# Patient Record
Sex: Female | Born: 1975
Health system: Southern US, Community
[De-identification: ages and names within clinical notes are randomized; demographics above are authoritative.]

## PROBLEM LIST (undated history)

## (undated) DIAGNOSIS — Z9221 Personal history of antineoplastic chemotherapy: Secondary | ICD-10-CM

## (undated) DIAGNOSIS — C50919 Malignant neoplasm of unspecified site of unspecified female breast: Secondary | ICD-10-CM

## (undated) DIAGNOSIS — C4359 Malignant melanoma of other part of trunk: Secondary | ICD-10-CM

## (undated) DIAGNOSIS — Z923 Personal history of irradiation: Secondary | ICD-10-CM

## (undated) HISTORY — PX: EXPLORATORY LAPAROTOMY: SUR591

## (undated) HISTORY — DX: Malignant melanoma of other part of trunk: C43.59

---

## 1998-03-31 ENCOUNTER — Ambulatory Visit (HOSPITAL_BASED_OUTPATIENT_CLINIC_OR_DEPARTMENT_OTHER): Admission: RE | Admit: 1998-03-31 | Discharge: 1998-03-31 | Payer: Self-pay | Admitting: General Surgery

## 1999-10-01 ENCOUNTER — Emergency Department (HOSPITAL_COMMUNITY): Admission: EM | Admit: 1999-10-01 | Discharge: 1999-10-01 | Payer: Self-pay | Admitting: Emergency Medicine

## 2000-01-08 DIAGNOSIS — C4359 Malignant melanoma of other part of trunk: Secondary | ICD-10-CM

## 2000-01-08 HISTORY — DX: Malignant melanoma of other part of trunk: C43.59

## 2001-03-31 ENCOUNTER — Other Ambulatory Visit: Admission: RE | Admit: 2001-03-31 | Discharge: 2001-03-31 | Payer: Self-pay | Admitting: Gynecology

## 2001-03-31 ENCOUNTER — Encounter: Payer: Self-pay | Admitting: Dermatology

## 2001-03-31 ENCOUNTER — Encounter: Admission: RE | Admit: 2001-03-31 | Discharge: 2001-03-31 | Payer: Self-pay | Admitting: Dermatology

## 2002-04-19 ENCOUNTER — Other Ambulatory Visit: Admission: RE | Admit: 2002-04-19 | Discharge: 2002-04-19 | Payer: Self-pay | Admitting: Gynecology

## 2002-05-14 ENCOUNTER — Encounter: Payer: Self-pay | Admitting: Gynecology

## 2002-05-14 ENCOUNTER — Ambulatory Visit (HOSPITAL_COMMUNITY): Admission: RE | Admit: 2002-05-14 | Discharge: 2002-05-14 | Payer: Self-pay | Admitting: Gynecology

## 2003-04-29 ENCOUNTER — Other Ambulatory Visit: Admission: RE | Admit: 2003-04-29 | Discharge: 2003-04-29 | Payer: Self-pay | Admitting: Gynecology

## 2004-04-30 ENCOUNTER — Other Ambulatory Visit: Admission: RE | Admit: 2004-04-30 | Discharge: 2004-04-30 | Payer: Self-pay | Admitting: Gynecology

## 2004-11-02 ENCOUNTER — Encounter (INDEPENDENT_AMBULATORY_CARE_PROVIDER_SITE_OTHER): Payer: Self-pay | Admitting: *Deleted

## 2004-11-02 ENCOUNTER — Ambulatory Visit (HOSPITAL_BASED_OUTPATIENT_CLINIC_OR_DEPARTMENT_OTHER): Admission: RE | Admit: 2004-11-02 | Discharge: 2004-11-02 | Payer: Self-pay | Admitting: Gynecology

## 2005-03-25 ENCOUNTER — Other Ambulatory Visit: Admission: RE | Admit: 2005-03-25 | Discharge: 2005-03-25 | Payer: Self-pay | Admitting: Gynecology

## 2006-03-27 ENCOUNTER — Other Ambulatory Visit: Admission: RE | Admit: 2006-03-27 | Discharge: 2006-03-27 | Payer: Self-pay | Admitting: Gynecology

## 2006-06-19 ENCOUNTER — Ambulatory Visit (HOSPITAL_COMMUNITY): Admission: RE | Admit: 2006-06-19 | Discharge: 2006-06-19 | Payer: Self-pay | Admitting: Gynecology

## 2007-04-09 ENCOUNTER — Other Ambulatory Visit: Admission: RE | Admit: 2007-04-09 | Discharge: 2007-04-09 | Payer: Self-pay | Admitting: Gynecology

## 2008-01-08 HISTORY — PX: BREAST LUMPECTOMY: SHX2

## 2008-10-26 ENCOUNTER — Encounter: Admission: RE | Admit: 2008-10-26 | Discharge: 2008-10-26 | Payer: Self-pay | Admitting: Gynecology

## 2008-10-26 ENCOUNTER — Encounter (INDEPENDENT_AMBULATORY_CARE_PROVIDER_SITE_OTHER): Payer: Self-pay | Admitting: Radiology

## 2008-11-02 ENCOUNTER — Ambulatory Visit: Payer: Self-pay | Admitting: Genetic Counselor

## 2008-11-03 ENCOUNTER — Encounter: Admission: RE | Admit: 2008-11-03 | Discharge: 2008-11-03 | Payer: Self-pay | Admitting: Gynecology

## 2008-11-30 ENCOUNTER — Encounter: Admission: RE | Admit: 2008-11-30 | Discharge: 2008-11-30 | Payer: Self-pay | Admitting: General Surgery

## 2008-11-30 ENCOUNTER — Ambulatory Visit (HOSPITAL_COMMUNITY): Admission: RE | Admit: 2008-11-30 | Discharge: 2008-11-30 | Payer: Self-pay | Admitting: General Surgery

## 2008-12-15 ENCOUNTER — Ambulatory Visit: Admission: RE | Admit: 2008-12-15 | Discharge: 2008-12-15 | Payer: Self-pay | Admitting: Oncology

## 2008-12-15 ENCOUNTER — Encounter: Payer: Self-pay | Admitting: Oncology

## 2008-12-16 ENCOUNTER — Ambulatory Visit (HOSPITAL_COMMUNITY): Admission: RE | Admit: 2008-12-16 | Discharge: 2008-12-16 | Payer: Self-pay | Admitting: Oncology

## 2008-12-19 ENCOUNTER — Ambulatory Visit: Payer: Self-pay | Admitting: Genetic Counselor

## 2008-12-21 ENCOUNTER — Ambulatory Visit: Payer: Self-pay | Admitting: Oncology

## 2008-12-21 LAB — CBC WITH DIFFERENTIAL/PLATELET
Basophils Absolute: 0 10*3/uL (ref 0.0–0.1)
Eosinophils Absolute: 0.1 10*3/uL (ref 0.0–0.5)
HGB: 13.6 g/dL (ref 11.6–15.9)
LYMPH%: 34.2 % (ref 14.0–49.7)
MCV: 90 fL (ref 79.5–101.0)
MONO#: 0.4 10*3/uL (ref 0.1–0.9)
MONO%: 6.7 % (ref 0.0–14.0)
NEUT#: 3.4 10*3/uL (ref 1.5–6.5)
Platelets: 341 10*3/uL (ref 145–400)
RDW: 12.9 % (ref 11.2–14.5)

## 2008-12-21 LAB — COMPREHENSIVE METABOLIC PANEL
Albumin: 4.1 g/dL (ref 3.5–5.2)
Alkaline Phosphatase: 51 U/L (ref 39–117)
BUN: 12 mg/dL (ref 6–23)
CO2: 23 mEq/L (ref 19–32)
Glucose, Bld: 105 mg/dL — ABNORMAL HIGH (ref 70–99)
Potassium: 4.1 mEq/L (ref 3.5–5.3)

## 2008-12-21 LAB — LACTATE DEHYDROGENASE: LDH: 118 U/L (ref 94–250)

## 2008-12-21 LAB — CANCER ANTIGEN 27.29: CA 27.29: 18 U/mL (ref 0–39)

## 2008-12-23 ENCOUNTER — Ambulatory Visit (HOSPITAL_COMMUNITY): Admission: RE | Admit: 2008-12-23 | Discharge: 2008-12-23 | Payer: Self-pay | Admitting: Oncology

## 2009-01-05 ENCOUNTER — Ambulatory Visit (HOSPITAL_COMMUNITY): Admission: RE | Admit: 2009-01-05 | Discharge: 2009-01-05 | Payer: Self-pay | Admitting: General Surgery

## 2009-01-05 ENCOUNTER — Ambulatory Visit (HOSPITAL_COMMUNITY): Admission: RE | Admit: 2009-01-05 | Discharge: 2009-01-05 | Payer: Self-pay | Admitting: Oncology

## 2009-01-07 HISTORY — PX: THYROIDECTOMY: SHX17

## 2009-01-31 ENCOUNTER — Ambulatory Visit (HOSPITAL_COMMUNITY): Admission: RE | Admit: 2009-01-31 | Discharge: 2009-02-01 | Payer: Self-pay | Admitting: General Surgery

## 2009-01-31 ENCOUNTER — Encounter (INDEPENDENT_AMBULATORY_CARE_PROVIDER_SITE_OTHER): Payer: Self-pay | Admitting: General Surgery

## 2009-02-07 ENCOUNTER — Ambulatory Visit: Payer: Self-pay | Admitting: Oncology

## 2009-02-09 LAB — CBC WITH DIFFERENTIAL/PLATELET
Eosinophils Absolute: 0.2 10*3/uL (ref 0.0–0.5)
MONO#: 0.4 10*3/uL (ref 0.1–0.9)
NEUT#: 4.3 10*3/uL (ref 1.5–6.5)
Platelets: 348 10*3/uL (ref 145–400)
RBC: 4.41 10*6/uL (ref 3.70–5.45)
RDW: 12.8 % (ref 11.2–14.5)
WBC: 7.5 10*3/uL (ref 3.9–10.3)

## 2009-02-16 LAB — CBC WITH DIFFERENTIAL/PLATELET
Eosinophils Absolute: 0.2 10*3/uL (ref 0.0–0.5)
HCT: 39.3 % (ref 34.8–46.6)
HGB: 13.4 g/dL (ref 11.6–15.9)
LYMPH%: 33.4 % (ref 14.0–49.7)
MONO#: 0.6 10*3/uL (ref 0.1–0.9)
NEUT#: 4.4 10*3/uL (ref 1.5–6.5)
NEUT%: 56.3 % (ref 38.4–76.8)
Platelets: 322 10*3/uL (ref 145–400)
WBC: 7.8 10*3/uL (ref 3.9–10.3)

## 2009-02-16 LAB — COMPREHENSIVE METABOLIC PANEL
CO2: 26 mEq/L (ref 19–32)
Calcium: 8.7 mg/dL (ref 8.4–10.5)
Creatinine, Ser: 0.69 mg/dL (ref 0.40–1.20)
Glucose, Bld: 100 mg/dL — ABNORMAL HIGH (ref 70–99)
Sodium: 138 mEq/L (ref 135–145)
Total Bilirubin: 0.7 mg/dL (ref 0.3–1.2)
Total Protein: 6.9 g/dL (ref 6.0–8.3)

## 2009-02-23 LAB — CBC WITH DIFFERENTIAL/PLATELET
Eosinophils Absolute: 0.1 10*3/uL (ref 0.0–0.5)
LYMPH%: 42.6 % (ref 14.0–49.7)
MONO#: 0.3 10*3/uL (ref 0.1–0.9)
NEUT#: 2.8 10*3/uL (ref 1.5–6.5)
Platelets: 305 10*3/uL (ref 145–400)
RBC: 4.25 10*6/uL (ref 3.70–5.45)
WBC: 5.8 10*3/uL (ref 3.9–10.3)
lymph#: 2.5 10*3/uL (ref 0.9–3.3)

## 2009-03-02 LAB — CBC WITH DIFFERENTIAL/PLATELET
Basophils Absolute: 0 10*3/uL (ref 0.0–0.1)
Eosinophils Absolute: 0.1 10*3/uL (ref 0.0–0.5)
HCT: 35.6 % (ref 34.8–46.6)
HGB: 12.4 g/dL (ref 11.6–15.9)
LYMPH%: 40 % (ref 14.0–49.7)
MCHC: 34.8 g/dL (ref 31.5–36.0)
MONO#: 0.4 10*3/uL (ref 0.1–0.9)
NEUT#: 2.5 10*3/uL (ref 1.5–6.5)
NEUT%: 49.6 % (ref 38.4–76.8)
Platelets: 292 10*3/uL (ref 145–400)
WBC: 5.1 10*3/uL (ref 3.9–10.3)
lymph#: 2.1 10*3/uL (ref 0.9–3.3)

## 2009-03-09 ENCOUNTER — Ambulatory Visit: Payer: Self-pay | Admitting: Oncology

## 2009-03-09 LAB — CBC WITH DIFFERENTIAL/PLATELET
BASO%: 0.9 % (ref 0.0–2.0)
EOS%: 0.9 % (ref 0.0–7.0)
HCT: 35.3 % (ref 34.8–46.6)
MCH: 30 pg (ref 25.1–34.0)
MCHC: 34.3 g/dL (ref 31.5–36.0)
MCV: 87.4 fL (ref 79.5–101.0)
MONO%: 5.7 % (ref 0.0–14.0)
NEUT%: 50.6 % (ref 38.4–76.8)
RDW: 13.4 % (ref 11.2–14.5)
lymph#: 2.3 10*3/uL (ref 0.9–3.3)

## 2009-03-09 LAB — COMPREHENSIVE METABOLIC PANEL
ALT: 26 U/L (ref 0–35)
AST: 21 U/L (ref 0–37)
Albumin: 3.7 g/dL (ref 3.5–5.2)
Calcium: 8.4 mg/dL (ref 8.4–10.5)
Chloride: 105 mEq/L (ref 96–112)
Potassium: 3.7 mEq/L (ref 3.5–5.3)

## 2009-03-13 ENCOUNTER — Encounter (HOSPITAL_COMMUNITY): Admission: RE | Admit: 2009-03-13 | Discharge: 2009-05-09 | Payer: Self-pay | Admitting: Internal Medicine

## 2009-03-16 LAB — CBC WITH DIFFERENTIAL/PLATELET
BASO%: 1.8 % (ref 0.0–2.0)
Basophils Absolute: 0.1 10*3/uL (ref 0.0–0.1)
EOS%: 1.8 % (ref 0.0–7.0)
HGB: 11.7 g/dL (ref 11.6–15.9)
MCH: 30 pg (ref 25.1–34.0)
MONO%: 6.8 % (ref 0.0–14.0)
RBC: 3.9 10*6/uL (ref 3.70–5.45)
RDW: 13.7 % (ref 11.2–14.5)
lymph#: 2 10*3/uL (ref 0.9–3.3)
nRBC: 0 % (ref 0–0)

## 2009-03-23 LAB — CBC WITH DIFFERENTIAL/PLATELET
BASO%: 0.9 % (ref 0.0–2.0)
Eosinophils Absolute: 0.1 10*3/uL (ref 0.0–0.5)
MCHC: 34.7 g/dL (ref 31.5–36.0)
MONO#: 0.4 10*3/uL (ref 0.1–0.9)
NEUT#: 1.8 10*3/uL (ref 1.5–6.5)
Platelets: 255 10*3/uL (ref 145–400)
RBC: 3.7 10*6/uL (ref 3.70–5.45)
RDW: 14.1 % (ref 11.2–14.5)
WBC: 4.5 10*3/uL (ref 3.9–10.3)
lymph#: 2.3 10*3/uL (ref 0.9–3.3)
nRBC: 0 % (ref 0–0)

## 2009-03-30 LAB — COMPREHENSIVE METABOLIC PANEL
AST: 31 U/L (ref 0–37)
Albumin: 3.8 g/dL (ref 3.5–5.2)
Alkaline Phosphatase: 51 U/L (ref 39–117)
BUN: 7 mg/dL (ref 6–23)
Calcium: 9 mg/dL (ref 8.4–10.5)
Chloride: 107 mEq/L (ref 96–112)
Glucose, Bld: 106 mg/dL — ABNORMAL HIGH (ref 70–99)
Potassium: 3.7 mEq/L (ref 3.5–5.3)
Sodium: 139 mEq/L (ref 135–145)
Total Protein: 6.7 g/dL (ref 6.0–8.3)

## 2009-03-30 LAB — CBC WITH DIFFERENTIAL/PLATELET
BASO%: 0.5 % (ref 0.0–2.0)
Basophils Absolute: 0 10*3/uL (ref 0.0–0.1)
EOS%: 1.1 % (ref 0.0–7.0)
HCT: 31.5 % — ABNORMAL LOW (ref 34.8–46.6)
HGB: 10.7 g/dL — ABNORMAL LOW (ref 11.6–15.9)
MCH: 29.8 pg (ref 25.1–34.0)
MONO#: 0.3 10*3/uL (ref 0.1–0.9)
NEUT%: 37.6 % — ABNORMAL LOW (ref 38.4–76.8)
RDW: 14.9 % — ABNORMAL HIGH (ref 11.2–14.5)
WBC: 3.8 10*3/uL — ABNORMAL LOW (ref 3.9–10.3)
lymph#: 2.1 10*3/uL (ref 0.9–3.3)

## 2009-04-06 LAB — CBC WITH DIFFERENTIAL/PLATELET
BASO%: 1.1 % (ref 0.0–2.0)
Basophils Absolute: 0 10*3/uL (ref 0.0–0.1)
EOS%: 0.9 % (ref 0.0–7.0)
HGB: 10.2 g/dL — ABNORMAL LOW (ref 11.6–15.9)
MCH: 30.4 pg (ref 25.1–34.0)
RDW: 15.4 % — ABNORMAL HIGH (ref 11.2–14.5)
lymph#: 2.1 10*3/uL (ref 0.9–3.3)

## 2009-04-11 ENCOUNTER — Ambulatory Visit: Payer: Self-pay | Admitting: Oncology

## 2009-04-12 LAB — CBC WITH DIFFERENTIAL/PLATELET
Eosinophils Absolute: 0 10*3/uL (ref 0.0–0.5)
LYMPH%: 48.4 % (ref 14.0–49.7)
MCH: 32.4 pg (ref 25.1–34.0)
MCHC: 34.8 g/dL (ref 31.5–36.0)
MCV: 92.9 fL (ref 79.5–101.0)
MONO%: 13.3 % (ref 0.0–14.0)
NEUT#: 1.5 10*3/uL (ref 1.5–6.5)
Platelets: 342 10*3/uL (ref 145–400)
RBC: 3.35 10*6/uL — ABNORMAL LOW (ref 3.70–5.45)

## 2009-04-20 LAB — CBC WITH DIFFERENTIAL/PLATELET
BASO%: 0.7 % (ref 0.0–2.0)
LYMPH%: 38.4 % (ref 14.0–49.7)
MCHC: 34.3 g/dL (ref 31.5–36.0)
MONO#: 0.5 10*3/uL (ref 0.1–0.9)
MONO%: 9.2 % (ref 0.0–14.0)
Platelets: 183 10*3/uL (ref 145–400)
RBC: 3.79 10*6/uL (ref 3.70–5.45)
WBC: 5.4 10*3/uL (ref 3.9–10.3)
nRBC: 0 % (ref 0–0)

## 2009-04-27 LAB — CBC WITH DIFFERENTIAL/PLATELET
BASO%: 0 % (ref 0.0–2.0)
EOS%: 2.6 % (ref 0.0–7.0)
MCH: 31.7 pg (ref 25.1–34.0)
MCHC: 34.7 g/dL (ref 31.5–36.0)
RBC: 3.57 10*6/uL — ABNORMAL LOW (ref 3.70–5.45)
RDW: 16.2 % — ABNORMAL HIGH (ref 11.2–14.5)
lymph#: 1.3 10*3/uL (ref 0.9–3.3)

## 2009-05-08 LAB — CBC WITH DIFFERENTIAL/PLATELET
BASO%: 0.6 % (ref 0.0–2.0)
EOS%: 0.2 % (ref 0.0–7.0)
HCT: 33 % — ABNORMAL LOW (ref 34.8–46.6)
LYMPH%: 38.8 % (ref 14.0–49.7)
MCH: 32.3 pg (ref 25.1–34.0)
MCHC: 33.3 g/dL (ref 31.5–36.0)
MCV: 96.8 fL (ref 79.5–101.0)
MONO#: 0.5 10*3/uL (ref 0.1–0.9)
MONO%: 10.7 % (ref 0.0–14.0)
NEUT%: 49.7 % (ref 38.4–76.8)
Platelets: 328 10*3/uL (ref 145–400)
RBC: 3.41 10*6/uL — ABNORMAL LOW (ref 3.70–5.45)

## 2009-05-11 ENCOUNTER — Ambulatory Visit: Payer: Self-pay | Admitting: Oncology

## 2009-05-15 LAB — CBC WITH DIFFERENTIAL/PLATELET
BASO%: 0 % (ref 0.0–2.0)
Basophils Absolute: 0 10*3/uL (ref 0.0–0.1)
EOS%: 0.8 % (ref 0.0–7.0)
HGB: 11.1 g/dL — ABNORMAL LOW (ref 11.6–15.9)
MCH: 32.6 pg (ref 25.1–34.0)
MCHC: 34.4 g/dL (ref 31.5–36.0)
MCV: 94.7 fL (ref 79.5–101.0)
MONO%: 7 % (ref 0.0–14.0)
RDW: 16.7 % — ABNORMAL HIGH (ref 11.2–14.5)

## 2009-05-22 LAB — CBC WITH DIFFERENTIAL/PLATELET
EOS%: 0 % (ref 0.0–7.0)
Eosinophils Absolute: 0 10*3/uL (ref 0.0–0.5)
MCH: 32.6 pg (ref 25.1–34.0)
MCV: 95.5 fL (ref 79.5–101.0)
MONO%: 26.7 % — ABNORMAL HIGH (ref 0.0–14.0)
NEUT#: 0.7 10*3/uL — ABNORMAL LOW (ref 1.5–6.5)
RBC: 3.31 10*6/uL — ABNORMAL LOW (ref 3.70–5.45)
RDW: 17.6 % — ABNORMAL HIGH (ref 11.2–14.5)
nRBC: 1 % — ABNORMAL HIGH (ref 0–0)

## 2009-05-29 LAB — CBC WITH DIFFERENTIAL/PLATELET
BASO%: 0.9 % (ref 0.0–2.0)
MCHC: 33.3 g/dL (ref 31.5–36.0)
MONO#: 1.8 10*3/uL — ABNORMAL HIGH (ref 0.1–0.9)
RBC: 3.23 10*6/uL — ABNORMAL LOW (ref 3.70–5.45)
RDW: 17.2 % — ABNORMAL HIGH (ref 11.2–14.5)
WBC: 9.1 10*3/uL (ref 3.9–10.3)
lymph#: 2 10*3/uL (ref 0.9–3.3)

## 2009-05-29 LAB — COMPREHENSIVE METABOLIC PANEL
AST: 36 U/L (ref 0–37)
Alkaline Phosphatase: 59 U/L (ref 39–117)
BUN: 11 mg/dL (ref 6–23)
Glucose, Bld: 109 mg/dL — ABNORMAL HIGH (ref 70–99)
Total Bilirubin: 0.6 mg/dL (ref 0.3–1.2)

## 2009-06-06 LAB — CBC WITH DIFFERENTIAL/PLATELET
Basophils Absolute: 0 10*3/uL (ref 0.0–0.1)
Eosinophils Absolute: 0.1 10*3/uL (ref 0.0–0.5)
HCT: 30.4 % — ABNORMAL LOW (ref 34.8–46.6)
HGB: 10.5 g/dL — ABNORMAL LOW (ref 11.6–15.9)
LYMPH%: 46.3 % (ref 14.0–49.7)
MCV: 97.8 fL (ref 79.5–101.0)
MONO%: 11.9 % (ref 0.0–14.0)
NEUT#: 1 10*3/uL — ABNORMAL LOW (ref 1.5–6.5)
NEUT%: 39 % (ref 38.4–76.8)
Platelets: 200 10*3/uL (ref 145–400)

## 2009-06-08 LAB — CBC WITH DIFFERENTIAL/PLATELET
EOS%: 0.4 % (ref 0.0–7.0)
MCH: 32.9 pg (ref 25.1–34.0)
MCV: 99.1 fL (ref 79.5–101.0)
MONO%: 30.1 % — ABNORMAL HIGH (ref 0.0–14.0)
RBC: 3.16 10*6/uL — ABNORMAL LOW (ref 3.70–5.45)
RDW: 16.1 % — ABNORMAL HIGH (ref 11.2–14.5)
nRBC: 0 % (ref 0–0)

## 2009-06-09 ENCOUNTER — Ambulatory Visit: Payer: Self-pay | Admitting: Oncology

## 2009-06-12 LAB — CBC WITH DIFFERENTIAL/PLATELET
BASO%: 0.5 % (ref 0.0–2.0)
Eosinophils Absolute: 0 10*3/uL (ref 0.0–0.5)
LYMPH%: 16.8 % (ref 14.0–49.7)
MCHC: 32.7 g/dL (ref 31.5–36.0)
MONO#: 0.8 10*3/uL (ref 0.1–0.9)
MONO%: 12.8 % (ref 0.0–14.0)
NEUT#: 4.1 10*3/uL (ref 1.5–6.5)
Platelets: 311 10*3/uL (ref 145–400)
RBC: 2.87 10*6/uL — ABNORMAL LOW (ref 3.70–5.45)
RDW: 15.4 % — ABNORMAL HIGH (ref 11.2–14.5)
WBC: 5.8 10*3/uL (ref 3.9–10.3)

## 2009-06-19 LAB — CBC WITH DIFFERENTIAL/PLATELET
BASO%: 0.5 % (ref 0.0–2.0)
EOS%: 1.1 % (ref 0.0–7.0)
HCT: 32.1 % — ABNORMAL LOW (ref 34.8–46.6)
MCH: 31.5 pg (ref 25.1–34.0)
MCHC: 32.4 g/dL (ref 31.5–36.0)
MONO#: 0.6 10*3/uL (ref 0.1–0.9)
NEUT%: 64.8 % (ref 38.4–76.8)
RBC: 3.3 10*6/uL — ABNORMAL LOW (ref 3.70–5.45)
RDW: 15.5 % — ABNORMAL HIGH (ref 11.2–14.5)
WBC: 5.5 10*3/uL (ref 3.9–10.3)
lymph#: 1.2 10*3/uL (ref 0.9–3.3)

## 2009-06-19 LAB — COMPREHENSIVE METABOLIC PANEL
ALT: 18 U/L (ref 0–35)
AST: 12 U/L (ref 0–37)
Albumin: 3.2 g/dL — ABNORMAL LOW (ref 3.5–5.2)
CO2: 20 mEq/L (ref 19–32)
Calcium: 7.7 mg/dL — ABNORMAL LOW (ref 8.4–10.5)
Chloride: 112 mEq/L (ref 96–112)
Potassium: 3.4 mEq/L — ABNORMAL LOW (ref 3.5–5.3)
Sodium: 143 mEq/L (ref 135–145)
Total Protein: 5.3 g/dL — ABNORMAL LOW (ref 6.0–8.3)

## 2009-06-19 LAB — TSH: TSH: 0.05 u[IU]/mL — ABNORMAL LOW (ref 0.350–4.500)

## 2009-06-20 ENCOUNTER — Ambulatory Visit: Admission: RE | Admit: 2009-06-20 | Discharge: 2009-08-21 | Payer: Self-pay | Admitting: Radiation Oncology

## 2009-06-26 LAB — CBC WITH DIFFERENTIAL/PLATELET
BASO%: 0.4 % (ref 0.0–2.0)
Basophils Absolute: 0 10*3/uL (ref 0.0–0.1)
EOS%: 1.5 % (ref 0.0–7.0)
HGB: 10.3 g/dL — ABNORMAL LOW (ref 11.6–15.9)
MCH: 31.2 pg (ref 25.1–34.0)
MCHC: 33 g/dL (ref 31.5–36.0)
MCV: 94.5 fL (ref 79.5–101.0)
MONO%: 12.5 % (ref 0.0–14.0)
RDW: 15.5 % — ABNORMAL HIGH (ref 11.2–14.5)
lymph#: 1.8 10*3/uL (ref 0.9–3.3)

## 2009-07-03 LAB — CBC WITH DIFFERENTIAL/PLATELET
BASO%: 1.2 % (ref 0.0–2.0)
LYMPH%: 21.6 % (ref 14.0–49.7)
MCHC: 34.4 g/dL (ref 31.5–36.0)
MCV: 95.3 fL (ref 79.5–101.0)
MONO%: 10.5 % (ref 0.0–14.0)
NEUT#: 2.3 10*3/uL (ref 1.5–6.5)
Platelets: 230 10*3/uL (ref 145–400)
RBC: 3.17 10*6/uL — ABNORMAL LOW (ref 3.70–5.45)
RDW: 17.3 % — ABNORMAL HIGH (ref 11.2–14.5)
WBC: 3.5 10*3/uL — ABNORMAL LOW (ref 3.9–10.3)

## 2009-07-03 LAB — URINALYSIS, MICROSCOPIC - CHCC
Bilirubin (Urine): NEGATIVE
Glucose: NEGATIVE g/dL
Nitrite: NEGATIVE

## 2009-08-07 ENCOUNTER — Ambulatory Visit: Payer: Self-pay | Admitting: Oncology

## 2009-08-09 LAB — COMPREHENSIVE METABOLIC PANEL
ALT: 29 U/L (ref 0–35)
AST: 27 U/L (ref 0–37)
Alkaline Phosphatase: 47 U/L (ref 39–117)
Calcium: 9.3 mg/dL (ref 8.4–10.5)
Chloride: 104 mEq/L (ref 96–112)
Creatinine, Ser: 0.64 mg/dL (ref 0.40–1.20)

## 2009-08-09 LAB — CBC WITH DIFFERENTIAL/PLATELET
BASO%: 0.4 % (ref 0.0–2.0)
HGB: 11.6 g/dL (ref 11.6–15.9)
MCHC: 33.6 g/dL (ref 31.5–36.0)
MCV: 91.8 fL (ref 79.5–101.0)
MONO#: 0.4 10*3/uL (ref 0.1–0.9)
NEUT#: 2.3 10*3/uL (ref 1.5–6.5)
NEUT%: 62 % (ref 38.4–76.8)
RBC: 3.77 10*6/uL (ref 3.70–5.45)
RDW: 14.7 % — ABNORMAL HIGH (ref 11.2–14.5)
lymph#: 0.6 10*3/uL — ABNORMAL LOW (ref 0.9–3.3)

## 2009-08-23 ENCOUNTER — Encounter (HOSPITAL_COMMUNITY): Admission: RE | Admit: 2009-08-23 | Discharge: 2009-09-26 | Payer: Self-pay | Admitting: Internal Medicine

## 2009-09-22 ENCOUNTER — Ambulatory Visit (HOSPITAL_BASED_OUTPATIENT_CLINIC_OR_DEPARTMENT_OTHER): Admission: RE | Admit: 2009-09-22 | Discharge: 2009-09-22 | Payer: Self-pay | Admitting: General Surgery

## 2009-10-27 ENCOUNTER — Encounter: Admission: RE | Admit: 2009-10-27 | Discharge: 2009-10-27 | Payer: Self-pay | Admitting: Radiation Oncology

## 2009-11-10 ENCOUNTER — Ambulatory Visit (HOSPITAL_BASED_OUTPATIENT_CLINIC_OR_DEPARTMENT_OTHER): Payer: BC Managed Care – PPO | Admitting: Oncology

## 2009-11-14 LAB — COMPREHENSIVE METABOLIC PANEL
AST: 19 U/L (ref 0–37)
Albumin: 3.6 g/dL (ref 3.5–5.2)
Alkaline Phosphatase: 84 U/L (ref 39–117)
BUN: 8 mg/dL (ref 6–23)
Calcium: 8.7 mg/dL (ref 8.4–10.5)
Chloride: 97 mEq/L (ref 96–112)
Creatinine, Ser: 0.64 mg/dL (ref 0.40–1.20)
Glucose, Bld: 95 mg/dL (ref 70–99)
Potassium: 3.6 mEq/L (ref 3.5–5.3)

## 2009-11-14 LAB — CBC WITH DIFFERENTIAL/PLATELET
Basophils Absolute: 0 10*3/uL (ref 0.0–0.1)
EOS%: 3.4 % (ref 0.0–7.0)
Eosinophils Absolute: 0.2 10*3/uL (ref 0.0–0.5)
HCT: 37.4 % (ref 34.8–46.6)
HGB: 12.9 g/dL (ref 11.6–15.9)
MCH: 30 pg (ref 25.1–34.0)
MCV: 86.9 fL (ref 79.5–101.0)
MONO%: 11.1 % (ref 0.0–14.0)
NEUT#: 2.8 10*3/uL (ref 1.5–6.5)
NEUT%: 58.9 % (ref 38.4–76.8)
Platelets: 329 10*3/uL (ref 145–400)

## 2010-01-27 ENCOUNTER — Other Ambulatory Visit: Payer: Self-pay | Admitting: Oncology

## 2010-01-27 DIAGNOSIS — C50919 Malignant neoplasm of unspecified site of unspecified female breast: Secondary | ICD-10-CM

## 2010-01-28 ENCOUNTER — Encounter: Payer: Self-pay | Admitting: Internal Medicine

## 2010-02-06 ENCOUNTER — Other Ambulatory Visit: Payer: Self-pay | Admitting: Internal Medicine

## 2010-02-06 DIAGNOSIS — C73 Malignant neoplasm of thyroid gland: Secondary | ICD-10-CM

## 2010-02-07 ENCOUNTER — Ambulatory Visit: Payer: BC Managed Care – PPO | Admitting: Oncology

## 2010-02-07 DIAGNOSIS — R209 Unspecified disturbances of skin sensation: Secondary | ICD-10-CM

## 2010-02-07 DIAGNOSIS — C73 Malignant neoplasm of thyroid gland: Secondary | ICD-10-CM

## 2010-02-07 DIAGNOSIS — G609 Hereditary and idiopathic neuropathy, unspecified: Secondary | ICD-10-CM

## 2010-02-07 DIAGNOSIS — C50919 Malignant neoplasm of unspecified site of unspecified female breast: Secondary | ICD-10-CM

## 2010-02-07 LAB — CBC WITH DIFFERENTIAL/PLATELET
BASO%: 0.4 % (ref 0.0–2.0)
Basophils Absolute: 0 10*3/uL (ref 0.0–0.1)
EOS%: 3 % (ref 0.0–7.0)
HCT: 37.4 % (ref 34.8–46.6)
HGB: 12.9 g/dL (ref 11.6–15.9)
MONO#: 0.4 10*3/uL (ref 0.1–0.9)
MONO%: 9.2 % (ref 0.0–14.0)
WBC: 4.5 10*3/uL (ref 3.9–10.3)
lymph#: 1.6 10*3/uL (ref 0.9–3.3)

## 2010-02-07 LAB — COMPREHENSIVE METABOLIC PANEL
AST: 17 U/L (ref 0–37)
Albumin: 3.8 g/dL (ref 3.5–5.2)
BUN: 12 mg/dL (ref 6–23)
Calcium: 9.4 mg/dL (ref 8.4–10.5)
Chloride: 103 mEq/L (ref 96–112)
Glucose, Bld: 98 mg/dL (ref 70–99)
Potassium: 4 mEq/L (ref 3.5–5.3)
Sodium: 142 mEq/L (ref 135–145)
Total Protein: 7 g/dL (ref 6.0–8.3)

## 2010-02-08 LAB — VITAMIN D 25 HYDROXY (VIT D DEFICIENCY, FRACTURES): Vit D, 25-Hydroxy: 24 ng/mL — ABNORMAL LOW (ref 30–89)

## 2010-02-08 LAB — CANCER ANTIGEN 27.29: CA 27.29: 16 U/mL (ref 0–39)

## 2010-02-12 ENCOUNTER — Other Ambulatory Visit: Payer: BC Managed Care – PPO

## 2010-02-12 ENCOUNTER — Ambulatory Visit
Admission: RE | Admit: 2010-02-12 | Discharge: 2010-02-12 | Disposition: A | Discharge status: Home / Self Care | Payer: BC Managed Care – PPO | Source: Ambulatory Visit | Attending: Internal Medicine | Admitting: Internal Medicine

## 2010-02-12 DIAGNOSIS — C73 Malignant neoplasm of thyroid gland: Secondary | ICD-10-CM

## 2010-02-13 ENCOUNTER — Encounter (HOSPITAL_BASED_OUTPATIENT_CLINIC_OR_DEPARTMENT_OTHER): Payer: BC Managed Care – PPO | Admitting: Oncology

## 2010-02-13 ENCOUNTER — Other Ambulatory Visit: Payer: Self-pay | Admitting: Oncology

## 2010-02-13 DIAGNOSIS — R209 Unspecified disturbances of skin sensation: Secondary | ICD-10-CM

## 2010-02-13 DIAGNOSIS — G609 Hereditary and idiopathic neuropathy, unspecified: Secondary | ICD-10-CM

## 2010-02-13 DIAGNOSIS — Z9889 Other specified postprocedural states: Secondary | ICD-10-CM

## 2010-02-13 DIAGNOSIS — C73 Malignant neoplasm of thyroid gland: Secondary | ICD-10-CM

## 2010-02-13 DIAGNOSIS — C50919 Malignant neoplasm of unspecified site of unspecified female breast: Secondary | ICD-10-CM

## 2010-02-28 ENCOUNTER — Other Ambulatory Visit: Payer: Self-pay | Admitting: Dermatology

## 2010-03-23 LAB — HCG, SERUM, QUALITATIVE: Preg, Serum: NEGATIVE

## 2010-03-26 LAB — COMPREHENSIVE METABOLIC PANEL
AST: 15 U/L (ref 0–37)
Albumin: 3.8 g/dL (ref 3.5–5.2)
Alkaline Phosphatase: 51 U/L (ref 39–117)
BUN: 10 mg/dL (ref 6–23)
Creatinine, Ser: 0.64 mg/dL (ref 0.4–1.2)
GFR calc Af Amer: >60 mL/min (ref >60)
Potassium: 4 mEq/L (ref 3.5–5.1)
Total Protein: 6.8 g/dL (ref 6.0–8.3)

## 2010-03-26 LAB — CBC
HCT: 39.5 % (ref 36.0–46.0)
Platelets: 239 10*3/uL (ref 150–400)
RDW: 12.7 % (ref 11.5–15.5)
WBC: 6.4 10*3/uL (ref 4.0–10.5)

## 2010-03-26 LAB — CALCIUM: Calcium: 9.2 mg/dL (ref 8.4–10.5)

## 2010-03-30 ENCOUNTER — Ambulatory Visit: Payer: BC Managed Care – PPO | Attending: Radiation Oncology | Admitting: Radiation Oncology

## 2010-04-02 LAB — HCG, SERUM, QUALITATIVE: Preg, Serum: NEGATIVE

## 2010-04-03 ENCOUNTER — Other Ambulatory Visit: Payer: Self-pay | Admitting: Internal Medicine

## 2010-04-03 DIAGNOSIS — R599 Enlarged lymph nodes, unspecified: Secondary | ICD-10-CM

## 2010-04-03 DIAGNOSIS — C73 Malignant neoplasm of thyroid gland: Secondary | ICD-10-CM

## 2010-04-09 LAB — CBC
MCHC: 33.7 g/dL (ref 30.0–36.0)
MCV: 89.5 fL (ref 78.0–100.0)
Platelets: 258 10*3/uL (ref 150–400)
RDW: 13 % (ref 11.5–15.5)

## 2010-04-09 LAB — T4: T4, Total: 6.8 ug/dL (ref 5.0–12.5)

## 2010-04-09 LAB — PREGNANCY, URINE: Preg Test, Ur: NEGATIVE

## 2010-04-09 LAB — T3: T3, Total: 114 ng/dl (ref 80.0–204.0)

## 2010-04-10 LAB — GLUCOSE, CAPILLARY: Glucose-Capillary: 87 mg/dL (ref 70–99)

## 2010-04-11 LAB — COMPREHENSIVE METABOLIC PANEL
ALT: 14 U/L (ref 0–35)
AST: 19 U/L (ref 0–37)
Albumin: 3.8 g/dL (ref 3.5–5.2)
Calcium: 9.2 mg/dL (ref 8.4–10.5)
GFR calc Af Amer: >60 mL/min (ref >60)
Glucose, Bld: 79 mg/dL (ref 70–99)
Sodium: 140 mEq/L (ref 135–145)
Total Protein: 6.4 g/dL (ref 6.0–8.3)

## 2010-04-11 LAB — URINALYSIS, ROUTINE W REFLEX MICROSCOPIC
Glucose, UA: NEGATIVE mg/dL
Ketones, ur: NEGATIVE mg/dL
pH: 5 (ref 5.0–8.0)

## 2010-04-11 LAB — DIFFERENTIAL
Eosinophils Absolute: 0.2 10*3/uL (ref 0.0–0.7)
Lymphs Abs: 2.8 10*3/uL (ref 0.7–4.0)
Monocytes Absolute: 0.7 10*3/uL (ref 0.1–1.0)
Monocytes Relative: 9 % (ref 3–12)
Neutrophils Relative %: 53 % (ref 43–77)

## 2010-04-11 LAB — CBC
MCHC: 35.1 g/dL (ref 30.0–36.0)
Platelets: 334 10*3/uL (ref 150–400)
RDW: 13 % (ref 11.5–15.5)

## 2010-04-12 ENCOUNTER — Ambulatory Visit
Admission: RE | Admit: 2010-04-12 | Discharge: 2010-04-12 | Disposition: A | Discharge status: Home / Self Care | Payer: BC Managed Care – PPO | Source: Ambulatory Visit | Attending: Internal Medicine | Admitting: Internal Medicine

## 2010-04-12 ENCOUNTER — Other Ambulatory Visit: Payer: BC Managed Care – PPO

## 2010-04-12 DIAGNOSIS — R599 Enlarged lymph nodes, unspecified: Secondary | ICD-10-CM

## 2010-04-12 DIAGNOSIS — C73 Malignant neoplasm of thyroid gland: Secondary | ICD-10-CM

## 2010-05-14 ENCOUNTER — Encounter (HOSPITAL_COMMUNITY): Payer: Self-pay

## 2010-05-14 ENCOUNTER — Encounter (HOSPITAL_BASED_OUTPATIENT_CLINIC_OR_DEPARTMENT_OTHER): Payer: BC Managed Care – PPO | Admitting: Oncology

## 2010-05-14 ENCOUNTER — Ambulatory Visit (HOSPITAL_COMMUNITY)
Admission: RE | Admit: 2010-05-14 | Discharge: 2010-05-14 | Disposition: A | Discharge status: Home / Self Care | Payer: BC Managed Care – PPO | Source: Ambulatory Visit | Attending: Oncology | Admitting: Oncology

## 2010-05-14 ENCOUNTER — Encounter (HOSPITAL_COMMUNITY)
Admission: RE | Admit: 2010-05-14 | Discharge: 2010-05-14 | Disposition: A | Discharge status: Home / Self Care | Payer: BC Managed Care – PPO | Source: Ambulatory Visit | Attending: Oncology | Admitting: Oncology

## 2010-05-14 ENCOUNTER — Other Ambulatory Visit: Payer: Self-pay | Admitting: Oncology

## 2010-05-14 DIAGNOSIS — G609 Hereditary and idiopathic neuropathy, unspecified: Secondary | ICD-10-CM

## 2010-05-14 DIAGNOSIS — K7689 Other specified diseases of liver: Secondary | ICD-10-CM | POA: Insufficient documentation

## 2010-05-14 DIAGNOSIS — C73 Malignant neoplasm of thyroid gland: Secondary | ICD-10-CM | POA: Insufficient documentation

## 2010-05-14 DIAGNOSIS — C50919 Malignant neoplasm of unspecified site of unspecified female breast: Secondary | ICD-10-CM

## 2010-05-14 DIAGNOSIS — E559 Vitamin D deficiency, unspecified: Secondary | ICD-10-CM

## 2010-05-14 DIAGNOSIS — R209 Unspecified disturbances of skin sensation: Secondary | ICD-10-CM

## 2010-05-14 DIAGNOSIS — Z79899 Other long term (current) drug therapy: Secondary | ICD-10-CM | POA: Insufficient documentation

## 2010-05-14 DIAGNOSIS — E320 Persistent hyperplasia of thymus: Secondary | ICD-10-CM | POA: Insufficient documentation

## 2010-05-14 HISTORY — DX: Malignant neoplasm of unspecified site of unspecified female breast: C50.919

## 2010-05-14 LAB — VITAMIN D 25 HYDROXY (VIT D DEFICIENCY, FRACTURES): Vit D, 25-Hydroxy: 32 ng/mL (ref 30–89)

## 2010-05-14 MED ORDER — FLUDEOXYGLUCOSE F - 18 (FDG) INJECTION
17.7000 | Freq: Once | INTRAVENOUS | Status: AC | PRN
  Administered 2010-05-14: 17.7 via INTRAVENOUS

## 2010-05-14 MED ORDER — IOHEXOL 300 MG/ML  SOLN
80.0000 mL | Freq: Once | INTRAMUSCULAR | Status: AC | PRN
  Administered 2010-05-14: 80 mL via INTRAVENOUS

## 2010-05-21 ENCOUNTER — Encounter (HOSPITAL_BASED_OUTPATIENT_CLINIC_OR_DEPARTMENT_OTHER): Payer: BC Managed Care – PPO | Admitting: Oncology

## 2010-05-21 DIAGNOSIS — C73 Malignant neoplasm of thyroid gland: Secondary | ICD-10-CM

## 2010-05-21 DIAGNOSIS — C50919 Malignant neoplasm of unspecified site of unspecified female breast: Secondary | ICD-10-CM

## 2010-05-21 DIAGNOSIS — Z171 Estrogen receptor negative status [ER-]: Secondary | ICD-10-CM

## 2010-05-25 NOTE — Op Note (Signed)
[name], [name]            ACCOUNT NO.:  [id number]   MEDICAL RECORD NO.:  [id number]          PATIENT TYPE:  AMB   LOCATION:  [facility]                         FACILITY:  [facility]   PHYSICIAN:  Timothy P. Fontaine, M.D.DATE OF BIRTH:  February 17, 1975   DATE OF PROCEDURE:  11/02/2004  DATE OF DISCHARGE:                                 OPERATIVE REPORT   PREOPERATIVE DIAGNOSES:  Infertility, rule out endometriosis.   POSTOPERATIVE DIAGNOSIS:  Endometriosis.   PROCEDURE:  Biopsy laser ablation endometriosis and chromopertubation,  Intercede placement.   SURGEON:  Dr. Audie Box.   ANESTHETIC:  General.   ESTIMATED BLOOD LOSS:  Minimal.   COMPLICATIONS:  None.   SPECIMEN:  Peritoneal biopsy.   FINDINGS:  Anterior cul-de-sac with diffuse, superficial, brown  endometriosis, geographic-type pattern.  Posterior cul-de-sac with focal  red, shaggy endometriosis mid to right cul-de-sac medial to the uterosacral  ligament.  Uterus normal size, shape, and contour.  Right and left fallopian  tubes normal length, caliber fimbriated ends with positive fill and spill of  dye bilaterally.  Right and left ovaries grossly normal, free, and mobile.  No other evidence of endometriosis or pelvic adhesive disease.  All visible  areas of endometriosis were laser ablated and covered with Intercede.  Upper  abdominal exam is normal.  Appendix is grossly normal, free, mobile.  Liver  smooth, no abnormalities, no perihepatic adhesions.  Gallbladder not  visualized.   PROCEDURE:  The patient was taken to the operating room, underwent general  anesthesia, was placed in low dorsal lithotomy position, received an  abdominal perineal vaginal preparation with Betadine solution.  Bladder  emptied with in-and-out Foley catheterization.  EUA performed, and a Cohen  cannula was placed on the cervix with a single-tooth tenaculum for the dye  study.  The patient was draped in usual fashion.  A vertical  infraumbilical  incision was made, the Veress needle placed, its position verified with  water, and approximately 2-1/2 L of carbon dioxide gas was infused without  difficulty.  The 10 mm laparoscopic trocar was then placed without  difficulty, its position verified visually.  A 5 mm suprapubic port was then  placed under direct visualization after transillumination for the vessels  without difficulty.  An examination of the pelvic organs and upper abdominal  exam was carried out with findings noted above.  The chromopertubation was  performed and positive fill and spill bilaterally noted.  Subsequently a  representative biopsy of the anterior cul-de-sac endometriosis was taken and  after assuring proper laser safety, the carbon dioxide laser on a setting of  10 watts continuous power was used to completely ablate all visible areas of  endometriosis.  Pelvis was copiously irrigated.  Subsequently Intercede was  placed over all laser-ablated areas.  The suprapubic port was removed and  gas allowed to [name] escape.  All areas and port sites showing adequate  hemostasis under low pressure situation.  The infraumbilical port was then  backed out under direct visualization, showing adequate hemostasis and no  evidence of hernia formation.  A 0 Vicryl interrupted  subcutaneous fascial stitch was placed infraumbilically.  Both port sites  were then reapproximated using tooth tenaculum were removed.  The patient  placed in the supine position, awakened without difficulty, and taken to  recovery room in good condition having tolerated the procedure well.      Timothy P. Fontaine, M.D.  Electronically Signed     TPF/MEDQ  D:  11/02/2004  T:  11/02/2004  Job:  [id number]

## 2010-05-25 NOTE — H&P (Signed)
Audrey Chambers, Audrey Chambers            ACCOUNT NO.:  [id number]   MEDICAL RECORD NO.:  [id number]          PATIENT TYPE:  AMB   LOCATION:  [facility]                         FACILITY:  [facility]   PHYSICIAN:  Timothy P. Fontaine, M.D.DATE OF BIRTH:  [birth year]   DATE OF ADMISSION:  11/02/2004  DATE OF DISCHARGE:                                HISTORY & PHYSICAL   CHIEF COMPLAINT:  Infertility.   HISTORY OF PRESENT ILLNESS:  A 35 year old gravida 0, admitted for laser  laparoscopy due to infertility.  The patient's outpatient evaluation  included regular cycles every 30 to 35 days, a normal HSG,  a semen analysis  with some decreased count at 25,000,000 and some agglutination.  The patient  underwent Clomed-Pergonal cycle x2 with poor stimulation.  She is admitted  at this time for laparoscopy.  She did have a normal TSH and a normal  prolactin level.  Alternatives for diagnosis and therapy were reviewed with  her and her husband and they want to proceed with laparoscopy.   PAST MEDICAL HISTORY:  Significant for melanoma in 2000 excised with no  special treatment or followup.   PAST SURGICAL HISTORY:  Melanoma excision.   ALLERGIES:  No medications.   CURRENT MEDICATIONS:  None.   REVIEW OF SYSTEMS:  Noncontributory.   FAMILY HISTORY:  Noncontributory.   SOCIAL HISTORY:  Noncontributory.   PHYSICAL EXAMINATION:  VITAL SIGNS: Afebrile.  Vital signs are stable.  HEENT:  Normal.  LUNGS:  Clear.  CARDIAC:  Regular rate.  No rubs, murmurs or gallops.  ABDOMEN:  Benign.  PELVIC:  External, BUS, vagina normal.  Cervix normal.  Uterus normal size,  midline and mobile, anteverted.  Adnexa without masses or tenderness.   ASSESSMENT:  A 35 year old gravida 0, normal HSG, semen analysis with a mild  decreased count and agglutination, normal TSH and prolactin, regular cycles  every 30 to 35 days, underwent Clomed-Pergonal stimulation x2 cycles with  only one follicle per cycle, for laser  laparoscopy.  The risks, benefits,  indications, and alternatives for the proposed surgery were discussed with  the patient and her husband.  The expected intraoperative and postoperative  courses were reviewed to include general anesthesia, insufflation, trocar  placement, multiple port sites, use of sharp and blunt dissection,  electrocautery, and laser.  The risks of inadvertent injury to internal  organs including bowel, bladder, ureters, vessels, and nerves either  immediately recognized or delay recognized necessitating major exploratory  reparative surgeries and future reparative surgeries including ostomy  formation was all discussed, understood and accepted.  The risks of vascular  injury relating to hemorrhage necessitating transfusion and the risks of  transfusion were reviewed to include transfusion reaction, hepatitis, HIV,  mad cow disease, and other unknown entities.  The risks of infection, both  internal requiring prolonged antibiotics as well as incisional requiring  opening and draining of the incisions, was all reviewed, understood and  accepted.  The patient understands there are no guarantees as far as  pregnancy are concerned.  She understands we may encounter endometriosis or  pelvic adhesive disease and that we will try to eradicate  disease as felt  safe to do.  If she has a negative laparoscopy, then we will terminate the  procedure at that time.  The patient's questions were answered to her  satisfaction.  She is ready to proceed with surgery.      Timothy P. Fontaine, M.D.  Electronically Signed     TPF/MEDQ  D:  10/31/2004  T:  10/31/2004  Job:  [id number]

## 2010-08-30 ENCOUNTER — Other Ambulatory Visit (HOSPITAL_COMMUNITY): Payer: Self-pay | Admitting: Internal Medicine

## 2010-08-30 DIAGNOSIS — C73 Malignant neoplasm of thyroid gland: Secondary | ICD-10-CM

## 2010-09-17 ENCOUNTER — Ambulatory Visit (HOSPITAL_COMMUNITY): Payer: BC Managed Care – PPO

## 2010-09-17 ENCOUNTER — Encounter (HOSPITAL_COMMUNITY)
Admission: RE | Admit: 2010-09-17 | Discharge: 2010-09-17 | Disposition: A | Discharge status: Home / Self Care | Payer: BC Managed Care – PPO | Source: Ambulatory Visit | Attending: Internal Medicine | Admitting: Internal Medicine

## 2010-09-17 DIAGNOSIS — C73 Malignant neoplasm of thyroid gland: Secondary | ICD-10-CM | POA: Insufficient documentation

## 2010-09-18 ENCOUNTER — Ambulatory Visit (HOSPITAL_COMMUNITY): Payer: BC Managed Care – PPO

## 2010-09-18 ENCOUNTER — Encounter (HOSPITAL_COMMUNITY)
Admission: RE | Admit: 2010-09-18 | Discharge: 2010-09-18 | Disposition: A | Discharge status: Home / Self Care | Payer: BC Managed Care – PPO | Source: Ambulatory Visit | Attending: Internal Medicine | Admitting: Internal Medicine

## 2010-09-18 DIAGNOSIS — C73 Malignant neoplasm of thyroid gland: Secondary | ICD-10-CM

## 2010-09-19 ENCOUNTER — Encounter (HOSPITAL_COMMUNITY): Payer: BC Managed Care – PPO

## 2010-09-19 ENCOUNTER — Ambulatory Visit (HOSPITAL_COMMUNITY): Payer: BC Managed Care – PPO

## 2010-09-21 ENCOUNTER — Ambulatory Visit (HOSPITAL_COMMUNITY)
Admission: RE | Admit: 2010-09-21 | Discharge: 2010-09-21 | Disposition: A | Discharge status: Home / Self Care | Payer: BC Managed Care – PPO | Source: Ambulatory Visit | Attending: Internal Medicine | Admitting: Internal Medicine

## 2010-09-21 DIAGNOSIS — C73 Malignant neoplasm of thyroid gland: Secondary | ICD-10-CM | POA: Insufficient documentation

## 2010-09-21 DIAGNOSIS — E0789 Other specified disorders of thyroid: Secondary | ICD-10-CM | POA: Insufficient documentation

## 2010-09-21 MED ORDER — SODIUM IODIDE I 131 CAPSULE
4.0000 | Freq: Once | INTRAVENOUS | Status: AC | PRN
  Administered 2010-09-21: 4 via ORAL

## 2010-10-15 ENCOUNTER — Other Ambulatory Visit: Payer: Self-pay | Admitting: Oncology

## 2010-10-15 DIAGNOSIS — Z9889 Other specified postprocedural states: Secondary | ICD-10-CM

## 2010-10-15 DIAGNOSIS — Z853 Personal history of malignant neoplasm of breast: Secondary | ICD-10-CM

## 2010-10-29 ENCOUNTER — Ambulatory Visit
Admission: RE | Admit: 2010-10-29 | Discharge: 2010-10-29 | Disposition: A | Discharge status: Home / Self Care | Payer: BC Managed Care – PPO | Source: Ambulatory Visit | Attending: Oncology | Admitting: Oncology

## 2010-10-29 ENCOUNTER — Telehealth (INDEPENDENT_AMBULATORY_CARE_PROVIDER_SITE_OTHER): Payer: Self-pay

## 2010-10-29 DIAGNOSIS — Z853 Personal history of malignant neoplasm of breast: Secondary | ICD-10-CM

## 2010-10-29 DIAGNOSIS — Z9889 Other specified postprocedural states: Secondary | ICD-10-CM

## 2010-10-29 MED ORDER — GADOBENATE DIMEGLUMINE 529 MG/ML IV SOLN
20.0000 mL | Freq: Once | INTRAVENOUS | Status: AC | PRN
  Administered 2010-10-29: 20 mL via INTRAVENOUS

## 2010-10-29 NOTE — Telephone Encounter (Signed)
Per Dr Aura Camps request, pt notified that mri shows no malignancy.

## 2010-11-06 ENCOUNTER — Other Ambulatory Visit: Payer: Self-pay | Admitting: Oncology

## 2010-11-06 ENCOUNTER — Encounter (HOSPITAL_BASED_OUTPATIENT_CLINIC_OR_DEPARTMENT_OTHER): Payer: BC Managed Care – PPO | Admitting: Oncology

## 2010-11-06 DIAGNOSIS — E559 Vitamin D deficiency, unspecified: Secondary | ICD-10-CM

## 2010-11-06 DIAGNOSIS — C73 Malignant neoplasm of thyroid gland: Secondary | ICD-10-CM

## 2010-11-06 DIAGNOSIS — G609 Hereditary and idiopathic neuropathy, unspecified: Secondary | ICD-10-CM

## 2010-11-06 DIAGNOSIS — R209 Unspecified disturbances of skin sensation: Secondary | ICD-10-CM

## 2010-11-06 LAB — COMPREHENSIVE METABOLIC PANEL
ALT: 16 U/L (ref 0–35)
CO2: 30 mEq/L (ref 19–32)
Calcium: 9.8 mg/dL (ref 8.4–10.5)
Chloride: 102 mEq/L (ref 96–112)
Sodium: 140 mEq/L (ref 135–145)
Total Protein: 7.2 g/dL (ref 6.0–8.3)

## 2010-11-06 LAB — CBC WITH DIFFERENTIAL/PLATELET
BASO%: 0.6 % (ref 0.0–2.0)
Eosinophils Absolute: 0.3 10*3/uL (ref 0.0–0.5)
HCT: 38 % (ref 34.8–46.6)
MCHC: 34.7 g/dL (ref 31.5–36.0)
MONO#: 0.5 10*3/uL (ref 0.1–0.9)
NEUT#: 3 10*3/uL (ref 1.5–6.5)
NEUT%: 48.3 % (ref 38.4–76.8)
RBC: 4.42 10*6/uL (ref 3.70–5.45)
WBC: 6.2 10*3/uL (ref 3.9–10.3)
lymph#: 2.4 10*3/uL (ref 0.9–3.3)

## 2010-11-07 LAB — CANCER ANTIGEN 27.29: CA 27.29: 19 U/mL (ref 0–39)

## 2010-11-13 ENCOUNTER — Ambulatory Visit (HOSPITAL_BASED_OUTPATIENT_CLINIC_OR_DEPARTMENT_OTHER): Payer: BC Managed Care – PPO | Admitting: Oncology

## 2010-11-13 ENCOUNTER — Encounter: Payer: Self-pay | Admitting: Oncology

## 2010-11-13 VITALS — BP 149/85 | HR 118 | Temp 98.4°F | Ht 66.0 in | Wt 233.8 lb

## 2010-11-13 DIAGNOSIS — C50919 Malignant neoplasm of unspecified site of unspecified female breast: Secondary | ICD-10-CM

## 2010-11-13 DIAGNOSIS — Z1501 Genetic susceptibility to malignant neoplasm of breast: Secondary | ICD-10-CM | POA: Insufficient documentation

## 2010-11-13 DIAGNOSIS — C73 Malignant neoplasm of thyroid gland: Secondary | ICD-10-CM

## 2010-11-13 DIAGNOSIS — C4359 Malignant melanoma of other part of trunk: Secondary | ICD-10-CM | POA: Insufficient documentation

## 2010-11-13 DIAGNOSIS — C779 Secondary and unspecified malignant neoplasm of lymph node, unspecified: Secondary | ICD-10-CM

## 2010-11-13 NOTE — Progress Notes (Signed)
Audrey Chambers is an 35 y.o. female.    Chief Complaint  Patient presents with  . Breast Cancer    HPI: In mid October of 2010 Audrey Chambers palpated a mass in her left breast. She brought this to Dr. Pauline Good attention and he set her up for bilateral mammography October of 20 can Dr. Jean Rosenthal was able to palpate a mobile nodule in the left breast at the 12:00 position which by mammography showed up as subtle questionable asymmetry. By ultrasound there was an irregular hypoechoic mass measuring 1.7 cm. Biopsy was performed the same day and showed (left eye [id number] and p.m. [phone]) an invasive mammary carcinoma which was estrogen and progesterone receptor negative with an MIB-1 of 78% and no HER-2 amplification, the ratio being 0.91.  With this information the patient was referred to Dr. Derrell Lolling and bilateral breast MRIs were obtained October 28 this showed a solitary mass in the left breast measuring 2.2 cm with no evidence of internal mammary or axillary lymph node positivity. At this point the patient was set up for genetic studies. She proved to be BRCA-1 positive. Scans showed no evidence of metastatic disease otherwise a right thyroid nodule noted at that time.  With this information the patient proceeded to left partial mastectomy with axillary lymph node sampling November 2010. The final pathology (S. [id number]) showed a 2.1 cm invasive ductal carcinoma with negative and ample margins but with evidence of lymphovascular invasion. One out of 3 sentinel lymph nodes was involved by micrometastatic deposit. The tumor was grade 3  Adjuvantly the patient was treated with carboplatin and paclitaxel x3 followed by dose dense doxorubicin and cyclophosphamide x4. This was followed by radiation completed in August of 2011.   Past Medical History  Diagnosis Date  . Breast ca     breast ca dx 11/10  . Melanoma of back 2002    resolved    Past Surgical History  Procedure Date  . Exploratory  laparotomy remote    for possible endometriosis  . Thyroidectomy January 2011    for papillary thyroid CA    Family History  Problem Relation Age of Onset  . Breast cancer Mother 19    survived  . Ovarian cancer Sister 34    died    Gynecologic history: The patient is GX P0 she never took oral contraceptives  Social History:  does not have a smoking history on file. She does not have any smokeless tobacco history on file. Her alcohol and drug histories not on file.  Health maintenance:  Cholesterol   Bone density   Colonoscopy   (PAP)    Allergies:  Allergies  Allergen Reactions  . Triple Antibiotic Rash    Medications Prior to Admission  Medication Sig Dispense Refill  . SYNTHROID 175 MCG tablet        No current facility-administered medications on file as of 11/13/2010.    ROS view of systems currently is benign she is having no unusual headaches visual changes cough phlegm production pleurisy shortness of breath chest pain or pressure or palpitations change in bowel or bladder habits bleeding rash fever adenopathy unusual weakness or weight loss. She is concerned about weight gain of today's visit discussing issues regarding general health maintenance review of systems was otherwise negative  Physical Exam:  Blood pressure 149/85, pulse 118, temperature 98.4 F (36.9 C), temperature source Oral, height 5\' 6"  (1.676 m), weight 233 lb 12.8 oz (106.051 kg).  Sclerae unicteric Oropharynx clear No peripheral  adenopathy Lungs no rales or rhonchi Heart regular rate and rhythm Abd benign MSK no focal spinal tenderness, no peripheral edema Neuro: nonfocal Breasts: Rest no suspicious masses left breast status post lumpectomy and radiation no evidence of local recurrence  CBC Lab Results  Component Value Date   WBC 6.4 01/23/2009   HGB 13.2 11/06/2010   HCT 38.0 11/06/2010   MCV 86.0 11/06/2010   PLT 315 11/06/2010   CMP     Component Value Date/Time   NA 140  11/06/2010 1455   K 3.8 11/06/2010 1455   CL 102 11/06/2010 1455   CO2 30 11/06/2010 1455   GLUCOSE 81 11/06/2010 1455   BUN 11 11/06/2010 1455   CREATININE 0.83 11/06/2010 1455   CALCIUM 9.8 11/06/2010 1455   PROT 7.2 11/06/2010 1455   ALBUMIN 3.8 11/06/2010 1455   AST 15 11/06/2010 1455   ALT 16 11/06/2010 1455   ALKPHOS 99 11/06/2010 1455   BILITOT 0.1* 11/06/2010 1455   GFRNONAA >60 01/23/2009 0950   GFRAA  Value: >60        The eGFR has been calculated using the MDRD equation. This calculation has not been validated in all clinical situations. eGFR's persistently <60 mL/min signify possible Chronic Kidney Disease. 01/23/2009 0950    FILMS: She had MRI of the breast and bilateral mammography in October of 2012, with no evidence of disease recurrence   Assessment: Year-old BRCA-1 positive Audrey Chambers woman with  (1) since ductal carcinoma post left lumpectomy and sentinel lymph node dissection November of 2010 for a T2  N1 mic : Negative invasive ductal carcinoma, with an MIB-1 of 78% treated adjuvantly with carboplatin and paclitaxel x3 followed by dose dense doxorubicin and cyclophosphamide x4 followed by radiation completed August of 2011  (2) papillary thyroid carcinoma post total thyroidectomy in January of 2011 5 of 6 lymph nodes involved, status post radioiodine ablation, with post treatment hypothyroidism, followed to doctor Kerr's office Plan:  First doing well with no evidence of disease recurrence she will see [name] again in May 13 before that visit she will be completely restaged with a CT of the chest and a PET scan she will also have lab work and a physical exam she will be seeing Dr. Sharl Ma in February and Dr. Chevis Pretty in July. She will work at increasing her exercise to 30-45 min 5x/week. She knows to call for any problems tht may deelop before the next visit.  MAGRINAT,GUSTAV C 11/13/2010, 10:22 AM

## 2010-12-16 IMAGING — CR DG CHEST 2V
2 series · 2 of 2 positions shown · non-contrast
Comparison: None available.

CLINICAL DATA: Preoperative respiratory film for patient with
breast cancer.

CHEST - 2 VIEW

[view not recorded (1 of 2)]
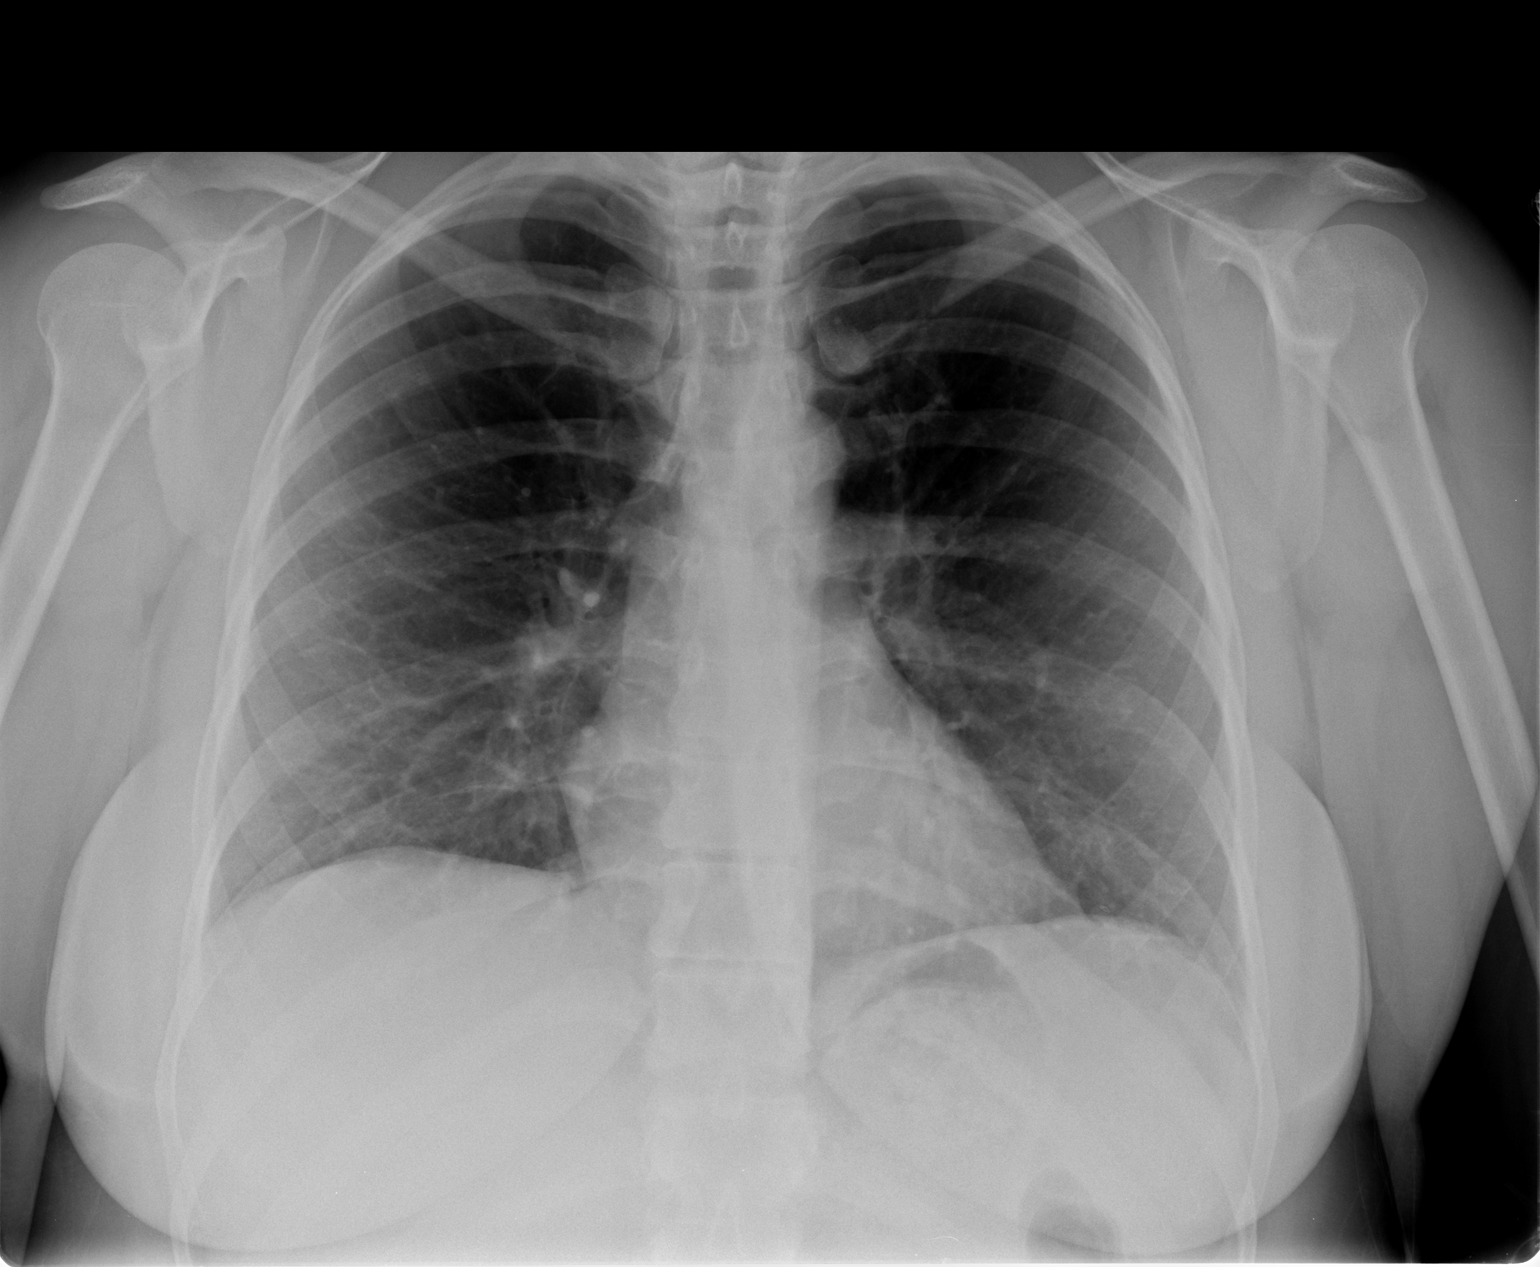

[view not recorded (2 of 2)]
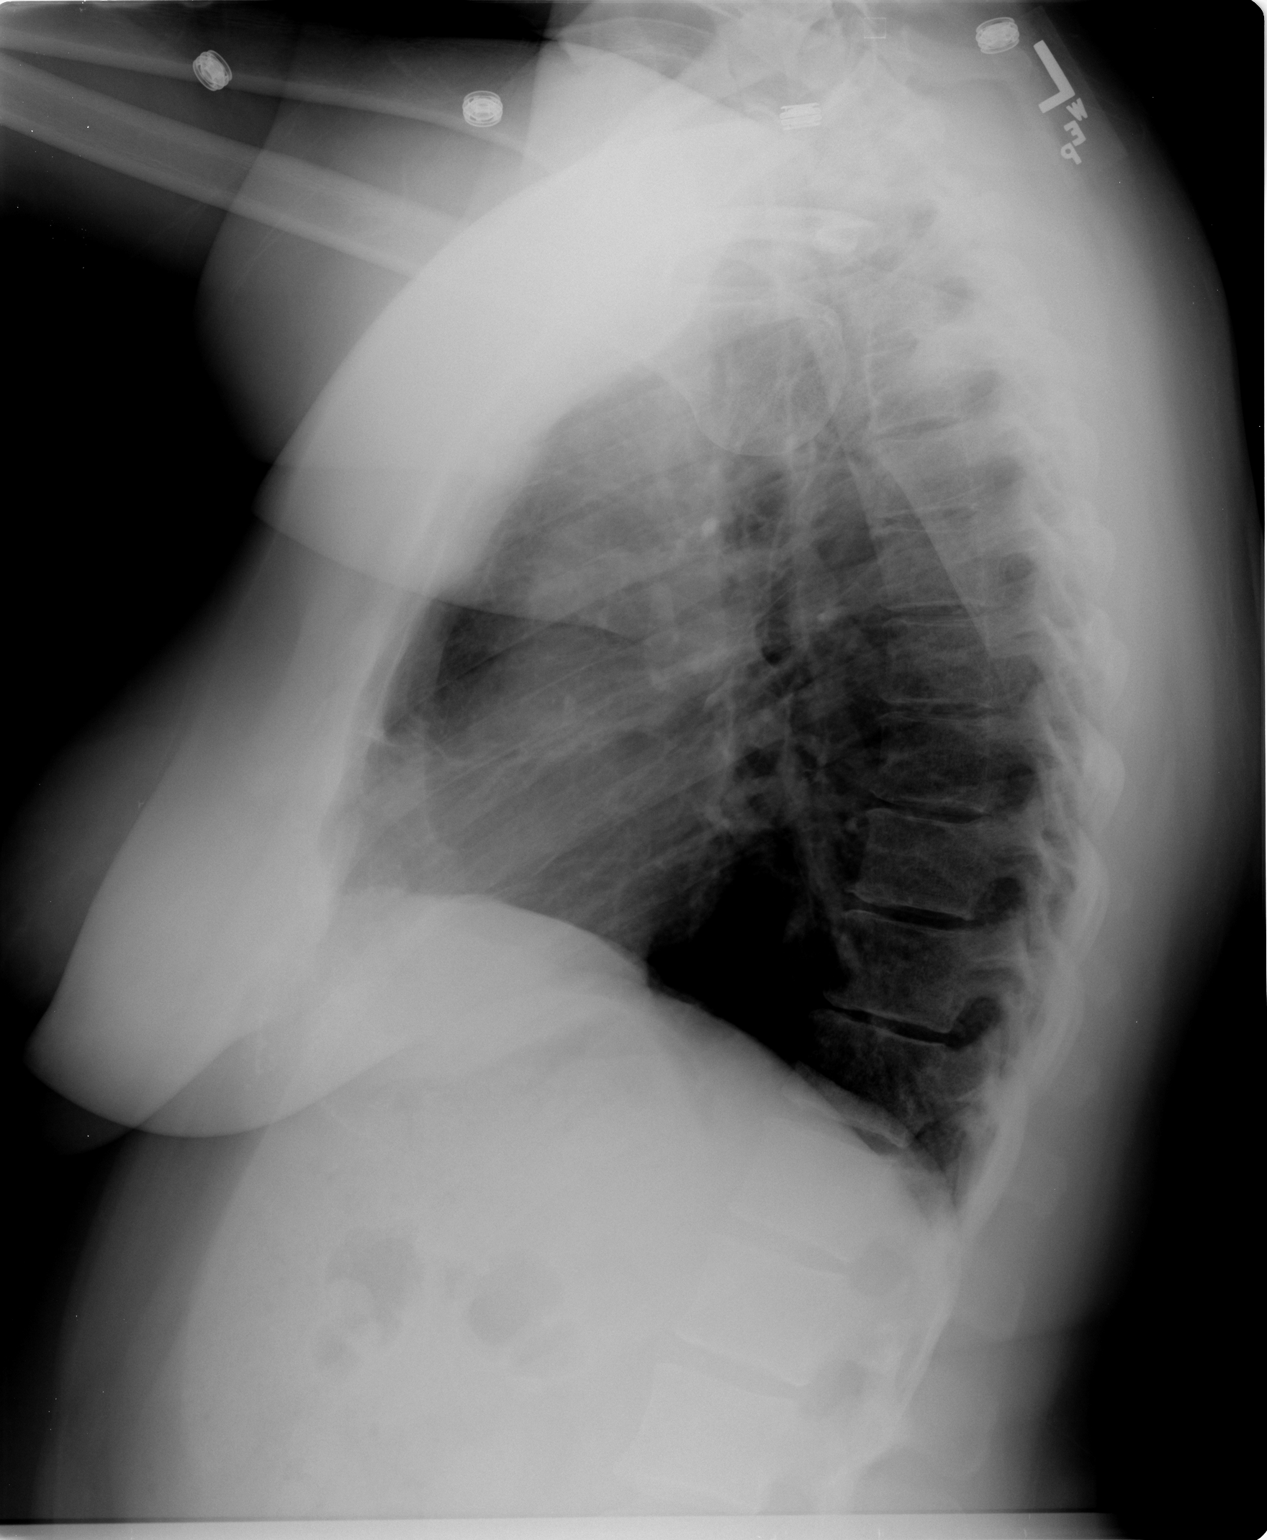

[2 of 2 positions shown; findings below may reference images not displayed]

FINDINGS: The lungs are clear without pulmonary nodule or mass.  No
pleural effusion.  Heart size normal.  No focal bony abnormality.
IMPRESSION: No acute disease.

## 2011-01-07 IMAGING — CT CT CHEST W/ CM
2 of 3 series · 15 of 36 positions shown, 18 images · IV contrast (omnipaque)
Comparison: PET CT 12/16/2008

CLINICAL DATA: Breast cancer diagnosed in October 2008.  Partial
left mastectomy.  Chemo therapy initiated.

CT CHEST WITH CONTRAST
TECHNIQUE: Multidetector CT imaging of the chest was performed
following the standard protocol during bolus administration of
intravenous contrast.
Contrast: 80 ml Omnipaque 300

[Series 2: chest with st · axial · 0.78mm/px · z∈[-270,-40]mm · 12 of 56 slices shown, 15 images]
[im 5/56  mediastinal]
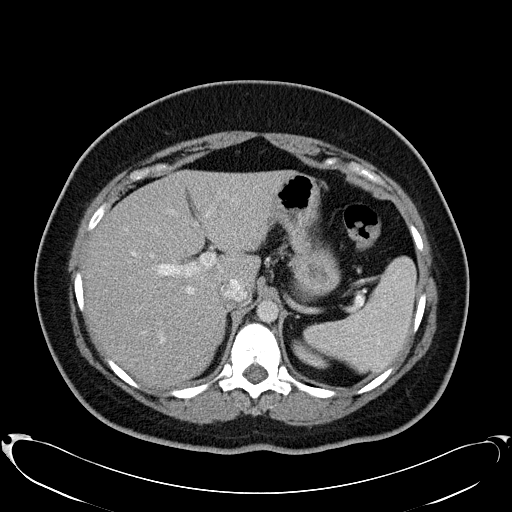
[im 5/56  lung]
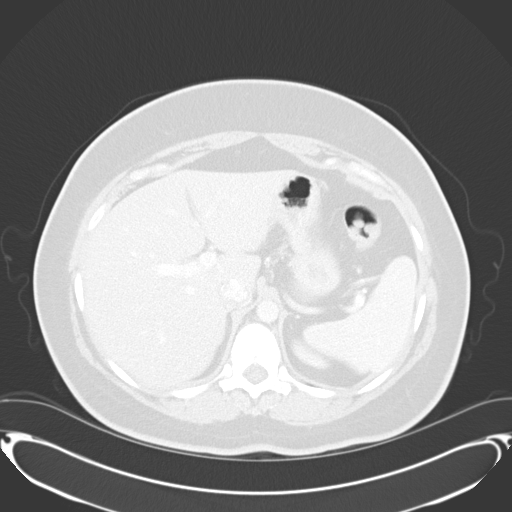
[im 9/56  lung]
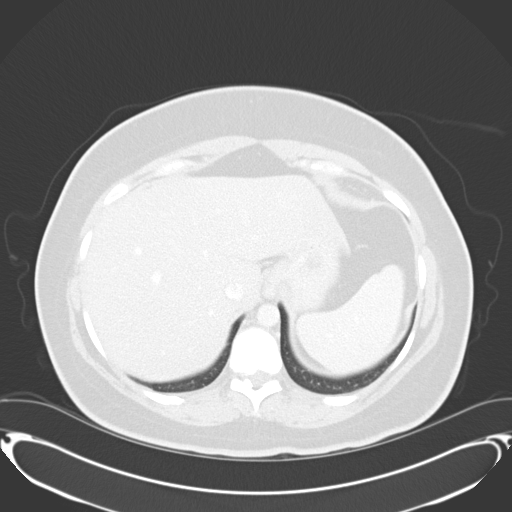
[im 13/56  lung]
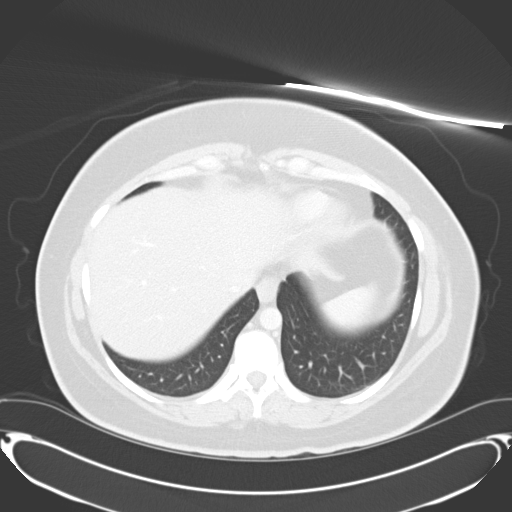
[im 17/56  lung]
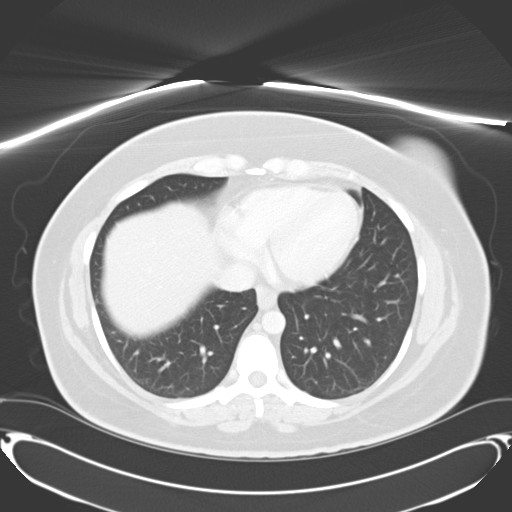
[im 21/56  mediastinal]
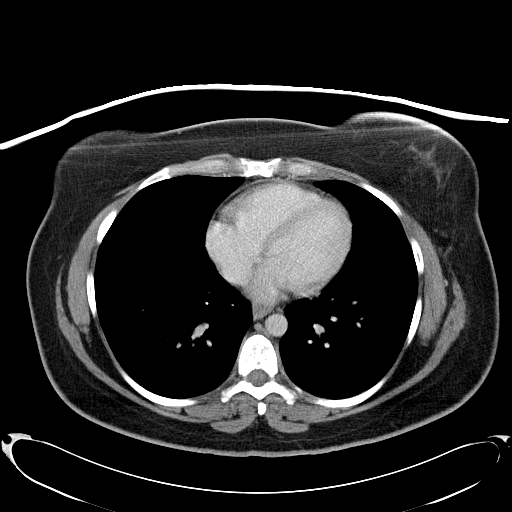
[im 21/56  lung]
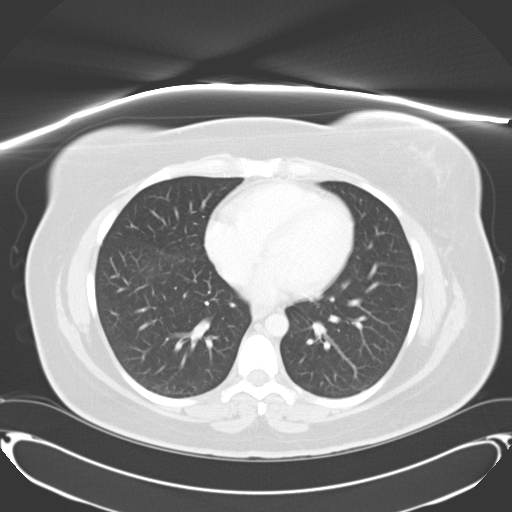
[im 25/56  lung]
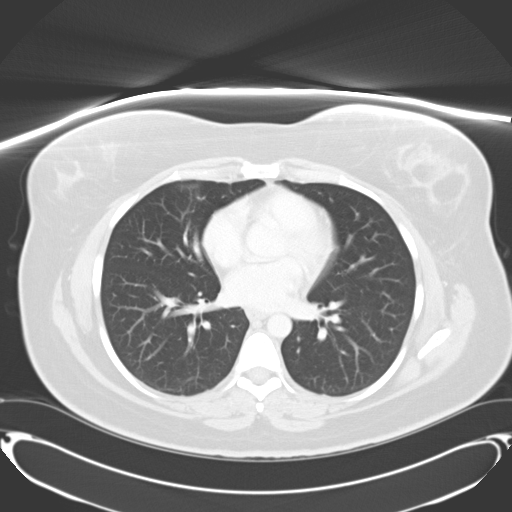
[im 31/56  lung]
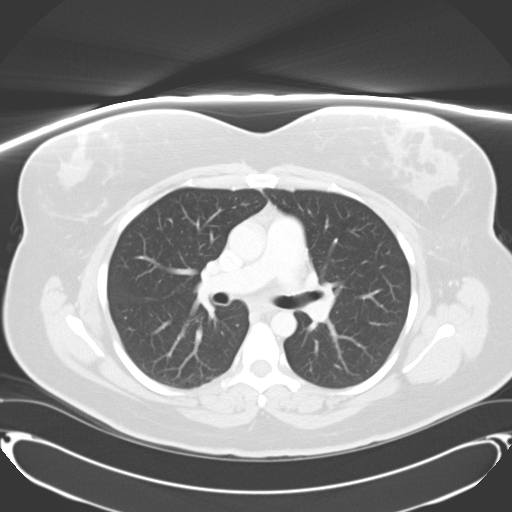
[im 35/56  lung]
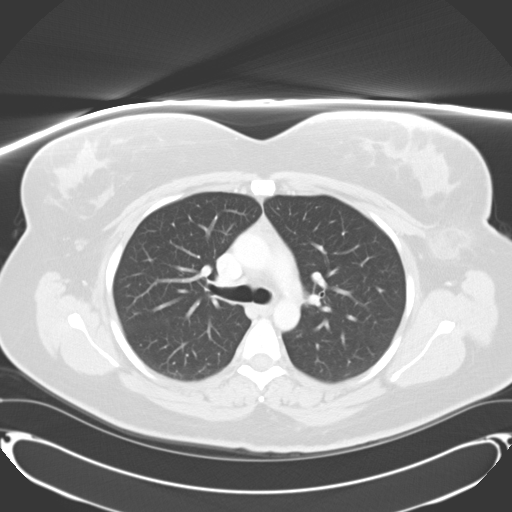
[im 39/56  mediastinal]
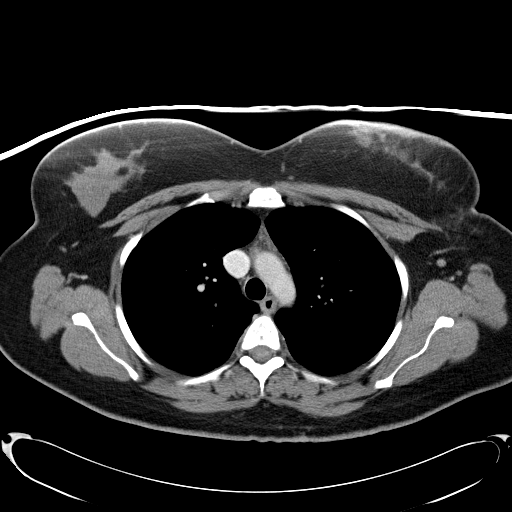
[im 39/56  lung]
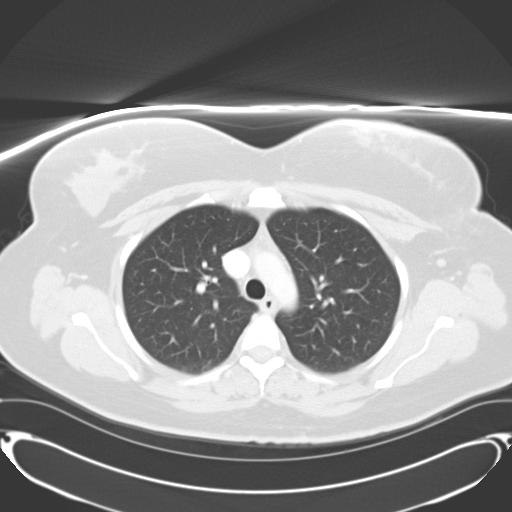
[im 43/56  lung]
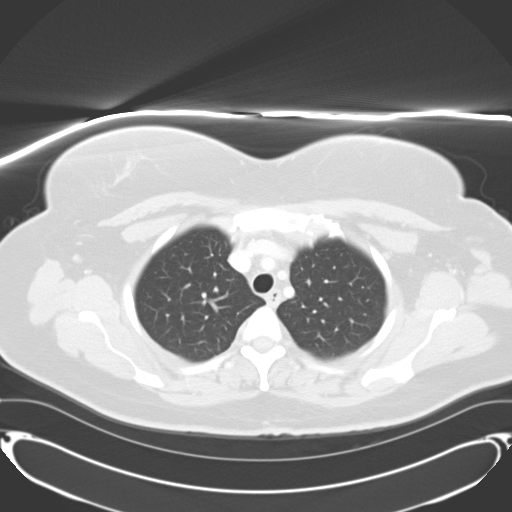
[im 47/56  lung]
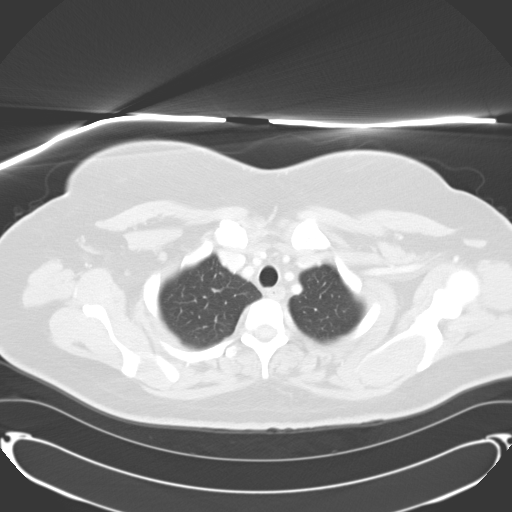
[im 51/56  lung]
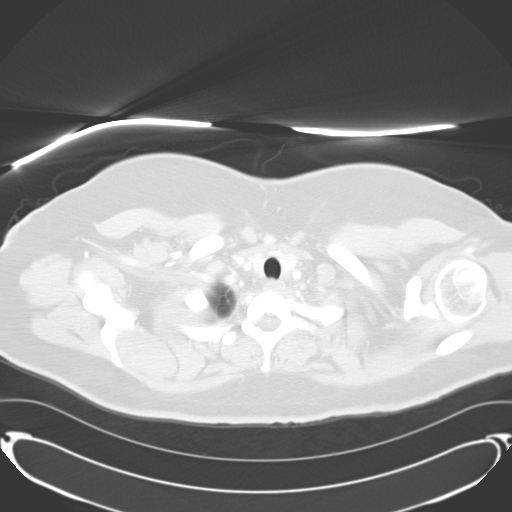

[Series 602: <mpr thick range> · coronal · 0.78mm/px · 3 of 77 slices shown]
[im 16/77  lung]
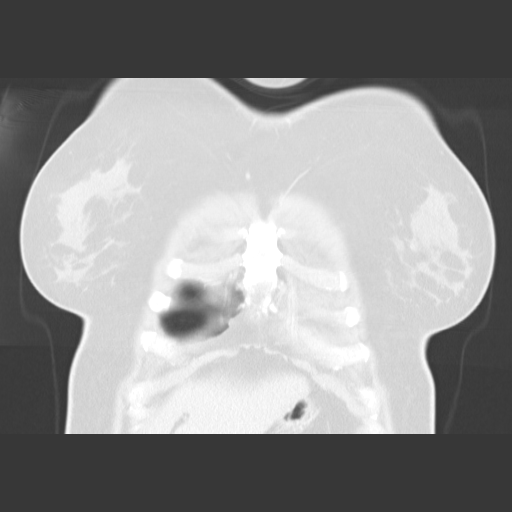
[im 31/77  lung]
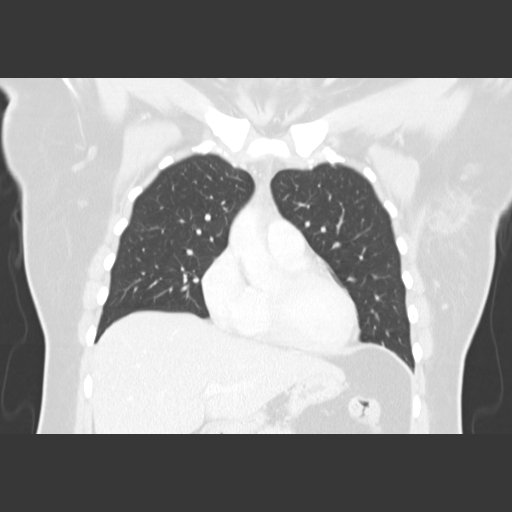
[im 46/77  lung]
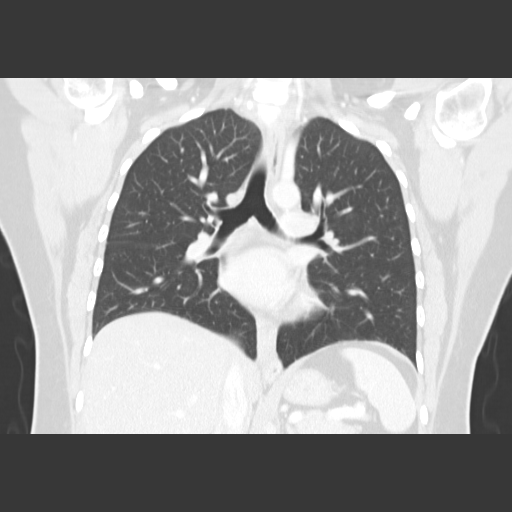

[15 of 36 positions shown; findings below may reference images not displayed]

FINDINGS: Postsurgical change in the left lateral breast (image
23).  No evidence of left axillary lymphadenopathy.  No evidence of
supraclavicular, infraclavicular, or internal mammary
lymphadenopathy.  No evidence of mediastinal or hilar
lymphadenopathy.

No suspicious pulmonary nodules.  Airways are normal.  Limited view
of the upper abdomen shows normal adrenal glands and no liver
lesions in the imaged portion of the liver.

Limited view of the skeleton is unremarkable.
IMPRESSION: 1.  Postsurgical change in the left breast.
2.  No evidence of metastasis or thoracic metastasis.

## 2011-01-07 IMAGING — CT NM PET TUM IMG INITIAL (PI) SKULL BASE T - THIGH
6 series · 25 of 25 positions shown · IV contrast (350 OM)
Comparison: CT thorax 12/16/2008

CLINICAL DATA: treatment strategy for .

NUCLEAR MEDICINE PET SKULL BASE TO THIGH
Fasting Blood Glucose:  87
TECHNIQUE: 16.5 mCi F-18 FDG was injected intravenously via the
right antecubital fossa.  Full-ring PET imaging was performed from
the skull base through the mid-thighs 55  minutes after injection.
CT data was obtained and used for attenuation correction and
anatomic localization only.  (This was not acquired as a diagnostic
CT examination.)

[Series 1: pet ac · axial · 3.3mm · 4.69mm/px · z∈[-870,+0]mm · 5 of 267 slices shown]
[im 1/267]
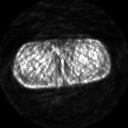
[im 67/267]
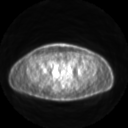
[im 134/267]
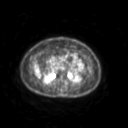
[im 200/267]
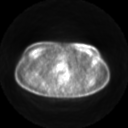
[im 267/267]
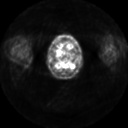

[Series 2: pet nac · axial · 3.3mm · 4.69mm/px · z∈[-870,+0]mm · 6 of 267 slices shown]
[im 1/267]
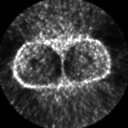
[im 54/267]
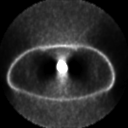
[im 107/267]
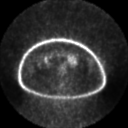
[im 160/267]
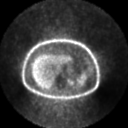
[im 213/267]
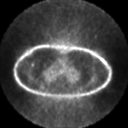
[im 267/267]
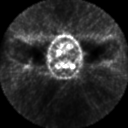

[Series 2: ct images · axial · 3.8mm · 0.98mm/px · z∈[-870,+0]mm · 5 of 263 slices shown]
[im 1/263]
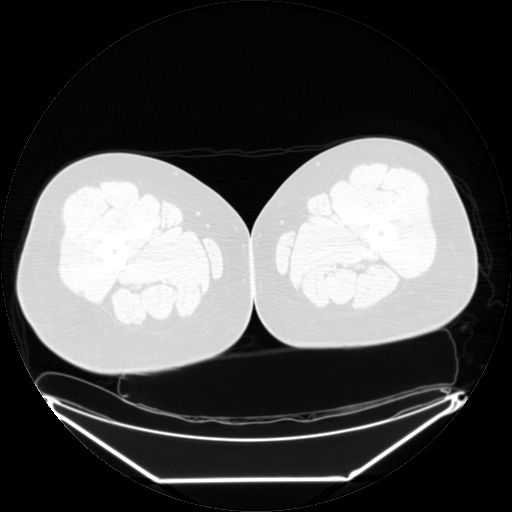
[im 66/263]
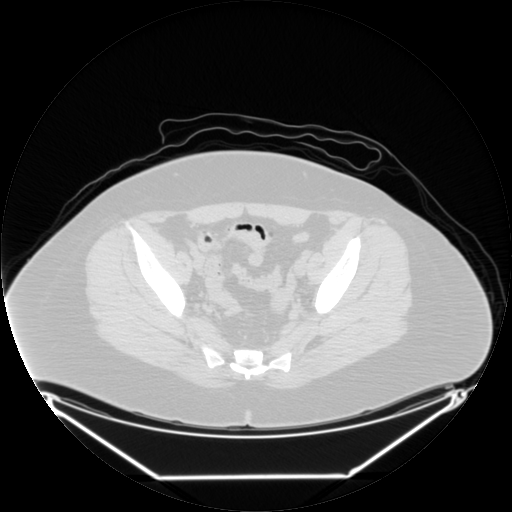
[im 132/263]
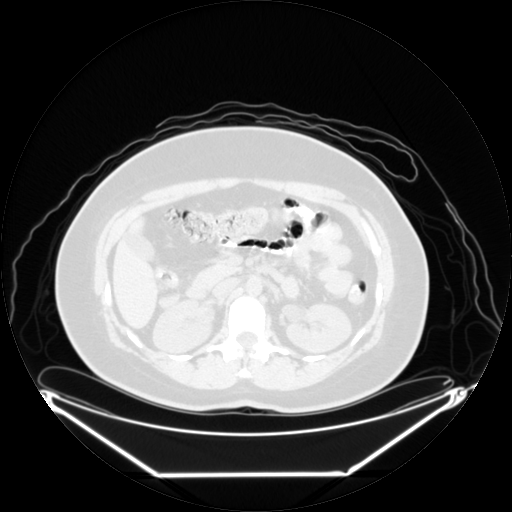
[im 197/263]
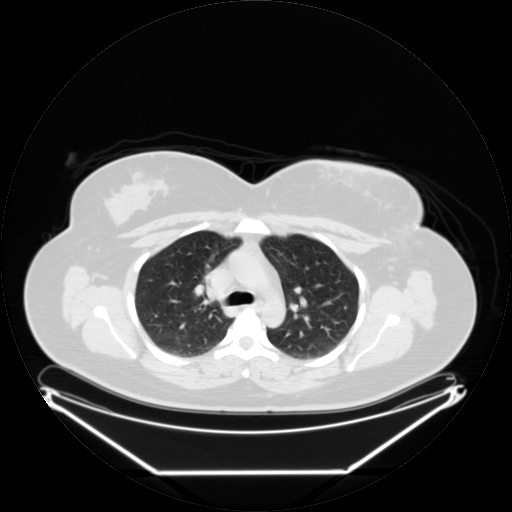
[im 263/263]
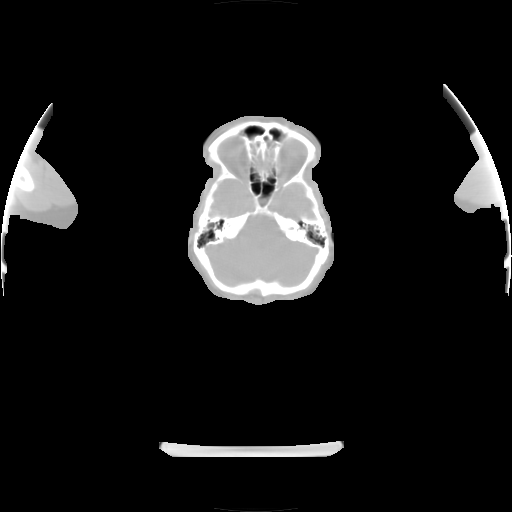

[Series 123: mip · coronal · 3.3mm · 4.69mm/px · 1 of 30 slices shown]
[im 1/30]
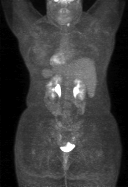

[Series 151: reformatted · axial · 3.3mm · 3.91mm/px · z∈[-870,+0]mm · 6 of 267 slices shown (1 of 2)]
[im 1/267]
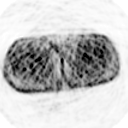
[im 54/267]
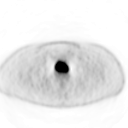
[im 107/267]
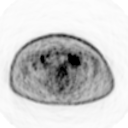
[im 160/267]
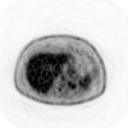
[im 213/267]
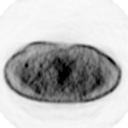
[im 267/267]
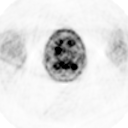

[Series 153: reformatted · coronal · 4.7mm · 6.98mm/px · 2 of 77 slices shown (2 of 2)]
[im 1/77]
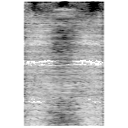
[im 77/77]
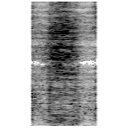

[25 of 25 positions shown; findings below may reference images not displayed]

FINDINGS: Neck:Within the right neck, at the level of the superior aspect of
the right lobe the thyroid gland.  There is a single focus of
hypermetabolic activity with SUV max = 9.4 This is difficult to
localize on the CT portion of the exam.  No clear lymph node in
this region.

No additional hypermetabolic nodes in the neck.

Chest:There is hypermetabolic activity within the lateral left
breast at prior lymphadenectomy/lumpectomy site (image 73).  There
is no clear hypermetabolic axillary lymph nodes  other than this
biopsy site.  No hypermetabolic mediastinal or hilar nodes.  Very
mild metabolic active associate with a right axillary node (image
57) is felt to be reactive.  No suspicious pulmonary nodules.

Abdomen / Pelvis:No abnormal hypermetabolic activity within the
solid organs.  No evidence of abdominal or pelvic hypermetabolic
nodes.

Skeleton:No focal hypermetabolic activity to suggest skeletal
metastasis.
IMPRESSION: 1.  Uptake within the left axillary lymphadenectomy site without
clear evidence of nodal metastasis.
2.  A single intense focus of hypermetabolic activity within the
right neck.  This would be unlikely location for solitary nodal
metastasis.  I favor this to represent focal uptake within the
right thyroid gland although this is difficult to localize on the
noncontrast comparison CT.  This area is not covered well on the
contrast enhanced CT.  Consider thyroid ultrasound to localize
lesion and/or node in this area of the right lobe the thyroid
gland.  If thryoid lesion is present, then concern is raised for
local thyroid cancer.

## 2011-05-15 ENCOUNTER — Encounter: Payer: Self-pay | Admitting: Oncology

## 2011-05-15 NOTE — Progress Notes (Signed)
05/15/2011  Left VM on patient's cell phone RE:  Pet scan and Chest Ct have been rescheduled for 05/20/2011 with an arrival time of 8:45 am. Because insurance company has not approved for tomorrow.  Left my number for patient to return call if so desired.

## 2011-05-15 NOTE — Progress Notes (Signed)
05/15/2011  Re: Audrey Chambers  This patient has a PET SCAN scheduled for Thursday, May 16, 2011.  After speaking with the RN Reviewer, the request has gone to MD Review.  I read your last clinical note to her and answered her questions.  I will need to call the patient and reschedule the Pet if not approved by 3:00 pm today because of the high cost of the contrast Nuc Med must order.  I realize you are in Breast Clinic today.  I will enclose the phone number and other information you will need for this review.    Thora Scherman                                         [phone], opt# 2 Doctor Line Member ID: [id number] DOB: 1975-04-02   I will also call [name] to make her aware of why I have rescheduled this exam.  This is where it goes BAD for Me, the patients scream at me because it was not approved.  Hopefully, this patient will not. I await a response from you,   Thank Marland Kitchen #[phone]

## 2011-05-16 ENCOUNTER — Inpatient Hospital Stay (HOSPITAL_COMMUNITY): Admission: RE | Admit: 2011-05-16 | Payer: BC Managed Care – PPO | Source: Ambulatory Visit

## 2011-05-16 ENCOUNTER — Inpatient Hospital Stay (HOSPITAL_COMMUNITY)
Admission: RE | Admit: 2011-05-16 | Discharge: 2011-05-16 | Payer: BC Managed Care – PPO | Source: Ambulatory Visit | Attending: Oncology | Admitting: Oncology

## 2011-05-16 ENCOUNTER — Other Ambulatory Visit (HOSPITAL_BASED_OUTPATIENT_CLINIC_OR_DEPARTMENT_OTHER): Payer: BC Managed Care – PPO | Admitting: Lab

## 2011-05-17 ENCOUNTER — Encounter: Payer: Self-pay | Admitting: Oncology

## 2011-05-17 ENCOUNTER — Telehealth: Payer: Self-pay | Admitting: *Deleted

## 2011-05-17 ENCOUNTER — Other Ambulatory Visit: Payer: Self-pay | Admitting: Oncology

## 2011-05-17 ENCOUNTER — Other Ambulatory Visit: Payer: Self-pay | Admitting: Medical Oncology

## 2011-05-17 DIAGNOSIS — C73 Malignant neoplasm of thyroid gland: Secondary | ICD-10-CM

## 2011-05-17 NOTE — Telephone Encounter (Signed)
pet scan cancelled add on to the ct is neck and chest on 05-20-2011

## 2011-05-17 NOTE — Progress Notes (Signed)
05/17/2011 Spoke with patient and gave her new time for the neck and chest ct scan scheduled for Monday.  Lab also scheduled for 12:45 PM. Patient voiced understanding.

## 2011-05-17 NOTE — Progress Notes (Signed)
05/17/2011  Good Morning Dr. Darnelle Catalan, Just wanted to touch base with you about this patient's PET Scan that was rescheduled for Monday morning, 05/20/2011. This scan did go to MD review.  Feven Alderfer Member# [id number], Doctor Line# [phone], option 2.  Nuclear Medicine will need the authorization number by 3:00 pm today.  Thank you again in advance for your assistance.  Renee Ramus [phone]

## 2011-05-20 ENCOUNTER — Inpatient Hospital Stay (HOSPITAL_COMMUNITY): Admission: RE | Admit: 2011-05-20 | Payer: BC Managed Care – PPO | Source: Ambulatory Visit

## 2011-05-20 ENCOUNTER — Ambulatory Visit (HOSPITAL_COMMUNITY)
Admission: RE | Admit: 2011-05-20 | Discharge: 2011-05-20 | Disposition: A | Discharge status: Home / Self Care | Payer: BC Managed Care – PPO | Source: Ambulatory Visit | Attending: Oncology | Admitting: Oncology

## 2011-05-20 ENCOUNTER — Other Ambulatory Visit: Payer: BC Managed Care – PPO | Admitting: Lab

## 2011-05-20 ENCOUNTER — Other Ambulatory Visit (HOSPITAL_COMMUNITY): Payer: BC Managed Care – PPO

## 2011-05-20 DIAGNOSIS — C50919 Malignant neoplasm of unspecified site of unspecified female breast: Secondary | ICD-10-CM | POA: Insufficient documentation

## 2011-05-20 DIAGNOSIS — Z923 Personal history of irradiation: Secondary | ICD-10-CM | POA: Insufficient documentation

## 2011-05-20 DIAGNOSIS — C73 Malignant neoplasm of thyroid gland: Secondary | ICD-10-CM | POA: Insufficient documentation

## 2011-05-20 DIAGNOSIS — Z9221 Personal history of antineoplastic chemotherapy: Secondary | ICD-10-CM | POA: Insufficient documentation

## 2011-05-20 DIAGNOSIS — R918 Other nonspecific abnormal finding of lung field: Secondary | ICD-10-CM | POA: Insufficient documentation

## 2011-05-20 LAB — CMP (CANCER CENTER ONLY)
ALT(SGPT): 18 U/L (ref 10–47)
AST: 18 U/L (ref 11–38)
CO2: 28 mEq/L (ref 18–33)
Creat: 0.6 mg/dl (ref 0.6–1.2)
Total Bilirubin: 0.6 mg/dl (ref 0.20–1.60)

## 2011-05-20 LAB — CBC WITH DIFFERENTIAL/PLATELET
EOS%: 2.8 % (ref 0.0–7.0)
MCH: 30.6 pg (ref 25.1–34.0)
MCHC: 34.5 g/dL (ref 31.5–36.0)
MCV: 88.8 fL (ref 79.5–101.0)
MONO%: 5.9 % (ref 0.0–14.0)
RBC: 4.41 10*6/uL (ref 3.70–5.45)
RDW: 13.3 % (ref 11.2–14.5)
nRBC: 0 % (ref 0–0)

## 2011-05-20 MED ORDER — IOHEXOL 300 MG/ML  SOLN
100.0000 mL | Freq: Once | INTRAMUSCULAR | Status: AC | PRN
  Administered 2011-05-20: 100 mL via INTRAVENOUS

## 2011-05-21 LAB — VITAMIN D 25 HYDROXY (VIT D DEFICIENCY, FRACTURES): Vit D, 25-Hydroxy: 31 ng/mL (ref 30–89)

## 2011-05-22 ENCOUNTER — Ambulatory Visit (HOSPITAL_BASED_OUTPATIENT_CLINIC_OR_DEPARTMENT_OTHER): Payer: BC Managed Care – PPO | Admitting: Oncology

## 2011-05-22 ENCOUNTER — Telehealth: Payer: Self-pay | Admitting: *Deleted

## 2011-05-22 VITALS — BP 117/80 | HR 101 | Temp 98.2°F | Ht 66.0 in | Wt 231.4 lb

## 2011-05-22 DIAGNOSIS — C50919 Malignant neoplasm of unspecified site of unspecified female breast: Secondary | ICD-10-CM

## 2011-05-22 DIAGNOSIS — C73 Malignant neoplasm of thyroid gland: Secondary | ICD-10-CM

## 2011-05-22 MED ORDER — GABAPENTIN 100 MG PO CAPS: 100.0000 mg | ORAL_CAPSULE | Freq: Every day | ORAL | Status: DC

## 2011-05-22 NOTE — Progress Notes (Signed)
ID: Robb Matar   DOB: 1975-09-05  MR#: [id number]  WUJ#:[id number]  HISTORY OF PRESENT ILLNESS: In mid October of 2010 Audrey Chambers palpated a mass in her left breast. She brought this to Dr. Pauline Good attention and he set her up for bilateral mammography October of 20 can Dr. Jean Rosenthal was able to palpate a mobile nodule in the left breast at the 12:00 position which by mammography showed up as subtle questionable asymmetry. By ultrasound there was an irregular hypoechoic mass measuring 1.7 cm. Biopsy was performed the same day and showed (left eye [phone] and p.m. [phone]) an invasive mammary carcinoma which was estrogen and progesterone receptor negative with an MIB-1 of 78% and no HER-2 amplification, the ratio being 0.91.  With this information the patient was referred to Dr. Derrell Lolling and bilateral breast MRIs were obtained October 28 this showed a solitary mass in the left breast measuring 2.2 cm with no evidence of internal mammary or axillary lymph node positivity. At this point the patient was set up for genetic studies. She proved to be BRCA-1 positive. Scans showed no evidence of metastatic disease otherwise a right thyroid nodule noted at that time.  With this information the patient proceeded to left partial mastectomy with axillary lymph node sampling November 2010. The final pathology (S. CEA [id number]) showed a 2.1 cm invasive ductal carcinoma with negative and ample margins but with evidence of lymphovascular invasion. One out of 3 sentinel lymph nodes was involved by micrometastatic deposit. The tumor was grade 3  Her subsequent history is as detailed below  INTERVAL HISTORY: Audrey Chambers returns today with her husband Alycia Rossetti for followup of her breast cancer. She is doing "great", walking about 20 minutes daily at lunchtime. Of course she has 2 jobs and that keeps her busy. She is concerned about her weight in today we discussed some strategies she may want to implement if she really wants to bring her  weight under control.   REVIEW OF SYSTEMS:  she still has a little bit of numbness in her feet from the chemotherapy. This is more noticeable when it's cold. She takes gabapentin at bedtime and that helps but if you're milligram dose is too much and makes her sleepy in the morning. Were dropping that to 100 mg accordingly. Otherwise a detailed review of systems today was entirely noncontributory   PAST MEDICAL HISTORY: Past Medical History  Diagnosis Date  . Breast CA     breast ca dx 11/10  . Melanoma of back 2002    resolved  Significant for melanoma removal approximately 10 years ago from the upper back.  Dr. Campbell Stall did the original excision and Dr. Francina Ames did the deeper surgery.  The patient tells me this was "superficial" and certainly being more than 10 years ago, this is almost certainly cured.  Polycystic ovaries. Status post exploratory laparotomy for what sounds like it may have been mild endometriosis.    PAST SURGICAL HISTORY: Past Surgical History  Procedure Date  . Exploratory laparotomy remote    for possible endometriosis  . Thyroidectomy January 2011    for papillary thyroid CA    FAMILY HISTORY Family History  Problem Relation Age of Onset  . Breast cancer Mother 28    survived  . Ovarian cancer Sister 49    died  The patient's father is alive at age 62.  His mother had breast cancer diagnosed in her sixties.  His only sister had ovarian cancer diagnosed in her 46s and died  from that disease.  The patient's mother is alive at 3.   The patient has one brother and one sister, both in good health.  The family is aware of the patient's genetic results and they are being tested.  GYNECOLOGIC HISTORY: The patient is GX P0 she never took oral contraceptives; s/p BSO November 2011 with benign pathology  SOCIAL HISTORY: She works at [employer] in Clinical biochemist.  Her husband, Alycia Rossetti, works for [employer] in the [employer].  They have no  children.  They have a dog and a cat at home.  The patient is not a church attender.     ADVANCED DIRECTIVES:  HEALTH MAINTENANCE: History  Substance Use Topics  . Smoking status: Not on file  . Smokeless tobacco: Not on file  . Alcohol Use:      Colonoscopy:  PAP: UTD/Mezer  Bone density:  Lipid panel:  Allergies  Allergen Reactions  . Neomycin-Bacitracin Zn-Polymyx Rash    Current Outpatient Prescriptions  Medication Sig Dispense Refill  . Calcium Carbonate-Vitamin D (CALCIUM 600 + D PO) Take 1 tablet by mouth daily.        . cetirizine (ZYRTEC) 10 MG tablet Take 10 mg by mouth daily.        Marland Kitchen SYNTHROID 175 MCG tablet         OBJECTIVE: Young-appearing white woman in no acute distress  0 Filed Vitals:   05/22/11 0950  BP: 117/80  Pulse: 101  Temp: 98.2 F (36.8 C)     Body mass index is 37.35 kg/(m^2).    ECOG FS: 0  Sclerae unicteric Oropharynx clear No peripheral adenopathy Lungs no rales or rhonchi Heart regular rate and rhythm Abd benign MSK no focal spinal tenderness, no peripheral edema Neuro: nonfocal Breasts: Right breast is unremarkable. Left breast is status post lumpectomy and radiation, with no evidence of local recurrence  LAB RESULTS: Lab Results  Component Value Date   WBC 6.6 05/20/2011   NEUTROABS 3.7 05/20/2011   HGB 13.5 05/20/2011   HCT 39.1 05/20/2011   MCV 88.8 05/20/2011   PLT 296 05/20/2011      Chemistry      Component Value Date/Time   NA 141 05/20/2011 1241   NA 140 11/06/2010 1455   K 4.1 05/20/2011 1241   K 3.8 11/06/2010 1455   CL 96* 05/20/2011 1241   CL 102 11/06/2010 1455   CO2 28 05/20/2011 1241   CO2 30 11/06/2010 1455   BUN 13 05/20/2011 1241   BUN 11 11/06/2010 1455   CREATININE 0.6 05/20/2011 1241   CREATININE 0.83 11/06/2010 1455      Component Value Date/Time   CALCIUM 8.6 05/20/2011 1241   CALCIUM 9.8 11/06/2010 1455   ALKPHOS 83 05/20/2011 1241   ALKPHOS 99 11/06/2010 1455   AST 18 05/20/2011 1241   AST 15  11/06/2010 1455   ALT 16 11/06/2010 1455   BILITOT 0.60 05/20/2011 1241   BILITOT 0.1* 11/06/2010 1455       Lab Results  Component Value Date   LABCA2 19 11/06/2010   LABCA2 19 11/06/2010    No components found with this basename: [id number]    No results found for this basename: INR:1;PROTIME:1 in the last 168 hours  Urinalysis    Component Value Date/Time   COLORURINE YELLOW 11/30/2008 0914   APPEARANCEUR CLEAR 11/30/2008 0914   LABSPEC 1.020 07/03/2009 1151   LABSPEC 1.022 11/30/2008 0914   PHURINE 5.0 11/30/2008 0914   GLUCOSEU  NEGATIVE 11/30/2008 0914   HGBUR NEGATIVE 11/30/2008 0914   BILIRUBINUR NEGATIVE 11/30/2008 0914   KETONESUR NEGATIVE 11/30/2008 0914   PROTEINUR NEGATIVE 11/30/2008 0914   UROBILINOGEN 0.2 11/30/2008 0914   NITRITE NEGATIVE 11/30/2008 0914   LEUKOCYTESUR NEGATIVE MICROSCOPIC NOT DONE ON URINES WITH NEGATIVE PROTEIN, BLOOD, LEUKOCYTES, NITRITE, OR GLUCOSE <1000 mg/dL. 11/30/2008 0914    STUDIES: Ct Soft Tissue Neck W Contrast  05/20/2011  *RADIOLOGY REPORT*  Clinical Data: 36 year old female with history of breast cancer and thyroid cancer.  Chemotherapy and radiation complete.  Status post thyroidectomy and radioactive iodine ablation (2011).  CT NECK WITH CONTRAST  Technique:  Multidetector CT imaging of the neck was performed with intravenous contrast.  Contrast: OMNIPAQUE IOHEXOL 300 MG/ML  SOLN In conjunction with CT(s) of the chest which is(are) reported separately.  Comparison: PET CT 05/14/2010 and earlier.  Findings: Chest findings are reported carefully.  Surgical clips at the thyroid resection site with no residual thyroid parenchyma evident by CT.  Small level IV and other thoracic inlet lymph nodes are within normal limits.  No cervical lymphadenopathy.  Negative larynx, pharynx, parapharyngeal spaces, retropharyngeal space, sublingual space, submandibular glands and parotid glands. Negative visualized brain parenchyma and orbit soft  tissues. Visualized paranasal sinuses and mastoids are clear.  Major vascular structures are patent; postoperative changes to the lower right internal jugular vein are suspected with collateral venous flow. No acute osseous abnormality identified.  IMPRESSION: 1.  No residual thyroid parenchyma, lymphadenopathy, or acute finding in the neck. 2.  Chest findings are reported separately.  Original Report Authenticated By: Harley Hallmark, M.D.   Ct Chest W Contrast  05/20/2011  *RADIOLOGY REPORT*  Clinical Data: Breast cancer  CT CHEST WITH CONTRAST  Technique:  Multidetector CT imaging of the chest was performed following the standard protocol during bolus administration of intravenous contrast.  Contrast: OMNIPAQUE IOHEXOL 300 MG/ML  SOLN  Comparison: 05/14/2010  Findings:  There is no enlarged axillary or supraclavicular adenopathy.  No internal mammary lymph node is identified.    There is no mediastinal or hilar adenopathy.  No pericardial or pleural effusion.  The esophagus appears normal.  The patient is status post thyroidectomy.  The tracheobronchial tree is patent.  Tiny nonspecific nodule in the right apex measures 3.7 mm, image #8.  Within the  anterior right upper upper lobe there is a 3.4 mm nodule, image 24.  Review of the visualized bony structures is negative.  There are no aggressive lytic or sclerotic lesions identified.  IMPRESSION:  1.  No acute findings and no evidence for metastatic disease. 2.  Tiny nonspecific pulmonary nodules in the right lung measure up to 3.7 mm.  If the patient is at high risk for bronchogenic carcinoma, follow-up chest CT at 1 year is recommended.  If the patient is at low risk, no follow-up is needed.  This recommendation follows the consensus statement: Guidelines for Management of Small Pulmonary Nodules Detected on CT Scans:  A Statement from the Fleischner Society as published in Radiology 2005; 237:395-400.  Original Report Authenticated By: Rosealee Albee, M.D.    ASSESSMENT: 36 year old BRCA-1 positive [place] woman    (1) status post post left lumpectomy and sentinel lymph node dissection November of 2010 for a T2 N1(mic), stage IB invasive ductal carcinoma, triple negative, with an MIB-1 of 78%, treated adjuvantly with carboplatin and paclitaxel x3 followed by dose dense doxorubicin and cyclophosphamide x4 followed by radiation completed August of 2011   (  2) papillary thyroid carcinoma post total thyroidectomy in January of 2011 with 5 of 6 lymph nodes involved, status post radioiodine ablation, with post treatment hypothyroidism, followed through doctor Kerr's office   (3) s/p BSP November 2011 with benign pathology  PLAN: Marquita is doing terrific as far as both her breast and thyroid cancers are concerned. With regard to thyroid cancer she is followed through Dr. Daune Perch office and she may have her lipid panel checked there at his or her discretion. She is going to see [name] again in 6 months. We will only do physical exam and lab work at that visit.   Silvanna Ohmer C    05/22/2011

## 2011-05-22 NOTE — Telephone Encounter (Signed)
gave patient appointment for 11-2011 with the lab one week before the md appointment printed out calendar and gave to the patient sent md an message about does the patient need to have an mammogram and mri of the breast

## 2011-05-23 ENCOUNTER — Other Ambulatory Visit: Payer: Self-pay | Admitting: Oncology

## 2011-05-30 ENCOUNTER — Telehealth: Payer: Self-pay | Admitting: Oncology

## 2011-05-30 ENCOUNTER — Other Ambulatory Visit: Payer: Self-pay | Admitting: Oncology

## 2011-05-30 ENCOUNTER — Other Ambulatory Visit: Payer: Self-pay | Admitting: *Deleted

## 2011-05-30 DIAGNOSIS — C50919 Malignant neoplasm of unspecified site of unspecified female breast: Secondary | ICD-10-CM

## 2011-05-30 NOTE — Telephone Encounter (Signed)
lmonvm of Audrey Chambers regarding the pt needing a breast mri appt at [facility]

## 2011-07-18 ENCOUNTER — Other Ambulatory Visit: Payer: Self-pay | Admitting: Oncology

## 2011-07-18 DIAGNOSIS — Z853 Personal history of malignant neoplasm of breast: Secondary | ICD-10-CM

## 2011-09-02 ENCOUNTER — Other Ambulatory Visit: Payer: Self-pay | Admitting: Internal Medicine

## 2011-09-02 DIAGNOSIS — C73 Malignant neoplasm of thyroid gland: Secondary | ICD-10-CM

## 2011-09-16 ENCOUNTER — Ambulatory Visit
Admission: RE | Admit: 2011-09-16 | Discharge: 2011-09-16 | Disposition: A | Discharge status: Home / Self Care | Payer: BC Managed Care – PPO | Source: Ambulatory Visit | Attending: Internal Medicine | Admitting: Internal Medicine

## 2011-09-16 DIAGNOSIS — C73 Malignant neoplasm of thyroid gland: Secondary | ICD-10-CM

## 2011-11-01 ENCOUNTER — Ambulatory Visit
Admission: RE | Admit: 2011-11-01 | Discharge: 2011-11-01 | Disposition: A | Discharge status: Home / Self Care | Payer: BC Managed Care – PPO | Source: Ambulatory Visit | Attending: Oncology | Admitting: Oncology

## 2011-11-01 DIAGNOSIS — C50919 Malignant neoplasm of unspecified site of unspecified female breast: Secondary | ICD-10-CM

## 2011-11-01 DIAGNOSIS — Z853 Personal history of malignant neoplasm of breast: Secondary | ICD-10-CM

## 2011-11-01 MED ORDER — GADOBENATE DIMEGLUMINE 529 MG/ML IV SOLN
20.0000 mL | Freq: Once | INTRAVENOUS | Status: AC | PRN
  Administered 2011-11-01: 20 mL via INTRAVENOUS

## 2011-11-06 ENCOUNTER — Telehealth: Payer: Self-pay | Admitting: Medical Oncology

## 2011-11-06 NOTE — Telephone Encounter (Signed)
Patient called requesting results of recent MRI.  Per Zollie Scale, MRI looks good.  Patient expressed understanding, no questions at this time.  Instructed patient to call with any questions or concerns.

## 2011-11-19 ENCOUNTER — Other Ambulatory Visit (HOSPITAL_BASED_OUTPATIENT_CLINIC_OR_DEPARTMENT_OTHER): Payer: BC Managed Care – PPO | Admitting: Lab

## 2011-11-19 DIAGNOSIS — C50919 Malignant neoplasm of unspecified site of unspecified female breast: Secondary | ICD-10-CM

## 2011-11-19 LAB — COMPREHENSIVE METABOLIC PANEL (CC13)
ALT: 18 U/L (ref 0–55)
Albumin: 3.8 g/dL (ref 3.5–5.0)
BUN: 14 mg/dL (ref 7.0–26.0)
CO2: 28 mEq/L (ref 22–29)
Calcium: 9.5 mg/dL (ref 8.4–10.4)
Chloride: 107 mEq/L (ref 98–107)
Creatinine: 0.8 mg/dL (ref 0.6–1.1)
Potassium: 4.4 mEq/L (ref 3.5–5.1)

## 2011-11-19 LAB — CBC WITH DIFFERENTIAL/PLATELET
BASO%: 0.4 % (ref 0.0–2.0)
Basophils Absolute: 0 10*3/uL (ref 0.0–0.1)
HCT: 40 % (ref 34.8–46.6)
HGB: 13.5 g/dL (ref 11.6–15.9)
MONO#: 0.5 10*3/uL (ref 0.1–0.9)
NEUT%: 52.8 % (ref 38.4–76.8)
RDW: 12.9 % (ref 11.2–14.5)
WBC: 5.5 10*3/uL (ref 3.9–10.3)
lymph#: 2 10*3/uL (ref 0.9–3.3)

## 2011-11-26 ENCOUNTER — Ambulatory Visit (HOSPITAL_BASED_OUTPATIENT_CLINIC_OR_DEPARTMENT_OTHER): Payer: BC Managed Care – PPO | Admitting: Oncology

## 2011-11-26 ENCOUNTER — Telehealth: Payer: Self-pay | Admitting: Oncology

## 2011-11-26 VITALS — BP 116/82 | HR 93 | Temp 98.5°F | Resp 20 | Ht 66.0 in | Wt 231.1 lb

## 2011-11-26 DIAGNOSIS — C50919 Malignant neoplasm of unspecified site of unspecified female breast: Secondary | ICD-10-CM

## 2011-11-26 DIAGNOSIS — Z171 Estrogen receptor negative status [ER-]: Secondary | ICD-10-CM

## 2011-11-26 DIAGNOSIS — Z23 Encounter for immunization: Secondary | ICD-10-CM

## 2011-11-26 DIAGNOSIS — C73 Malignant neoplasm of thyroid gland: Secondary | ICD-10-CM

## 2011-11-26 MED ORDER — INFLUENZA VIRUS VACC SPLIT PF IM SUSP
0.5000 mL | Freq: Once | INTRAMUSCULAR | Status: AC
  Administered 2011-11-26: 0.5 mL via INTRAMUSCULAR
  Filled 2011-11-26: qty 0.5

## 2011-11-26 NOTE — Telephone Encounter (Signed)
gve the pt her may 2014 appt calendar 

## 2011-11-26 NOTE — Progress Notes (Signed)
ID: Robb Matar   DOB: 04-Sep-1975  MR#: [id number]  FAO#:[id number]  PCP: No primary provider on file. GYN: Teodora Medici SU: OTHER MD: Campbell Stall, Debara Pickett   HISTORY OF PRESENT ILLNESS: In mid October of 2010 Nathania palpated a mass in her left breast. She brought this to Dr. Pauline Good attention and he set her up for bilateral mammography October of 20 can Dr. Jean Rosenthal was able to palpate a mobile nodule in the left breast at the 12:00 position which by mammography showed up as subtle questionable asymmetry. By ultrasound there was an irregular hypoechoic mass measuring 1.7 cm. Biopsy was performed the same day and showed (left eye [id number] and p.m. 10-731) an invasive mammary carcinoma which was estrogen and progesterone receptor negative with an MIB-1 of 78% and no HER-2 amplification, the ratio being 0.91.  With this information the patient was referred to Dr. Derrell Lolling and bilateral breast MRIs were obtained October 28 this showed a solitary mass in the left breast measuring 2.2 cm with no evidence of internal mammary or axillary lymph node positivity. At this point the patient was set up for genetic studies. She proved to be BRCA-1 positive. Scans showed no evidence of metastatic disease otherwise a right thyroid nodule noted at that time.  With this information the patient proceeded to left partial mastectomy with axillary lymph node sampling November 2010. The final pathology (S. CEA [id number]) showed a 2.1 cm invasive ductal carcinoma with negative and ample margins but with evidence of lymphovascular invasion. One out of 3 sentinel lymph nodes was involved by micrometastatic deposit. The tumor was grade 3  Her subsequent history is as detailed below  INTERVAL HISTORY: Talan returns today with her husband Alycia Rossetti for followup of her breast cancer. 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 She's  gained some weight, which bothers her.  REVIEW OF SYSTEMS: She has minimal residual peripheral neuropathy symptoms, which are not disabling. She doesn't have hot flashes or vaginal dryness problems. A detailed review of systems today was otherwise entirely negative.  PAST MEDICAL HISTORY: Past Medical History  Diagnosis Date  . Breast CA     breast ca dx 11/10  . Melanoma of back 2002    resolved  Significant for melanoma removal approximately 10 years ago from the upper back.  Dr. Campbell Stall did the original excision and Dr. Francina Ames did the deeper surgery.  The patient tells me this was "superficial" and certainly being more than 10 years ago, this is almost certainly cured.  Polycystic ovaries. Status post exploratory laparotomy for what sounds like it may have been mild endometriosis.    PAST SURGICAL HISTORY: Past Surgical History  Procedure Date  . Exploratory laparotomy remote    for possible endometriosis  . Thyroidectomy January 2011    for papillary thyroid CA    FAMILY HISTORY Family History  Problem Relation Age of Onset  . Breast cancer Mother 44    survived  . Ovarian cancer Sister 53    died  The patient's father is alive at age 81.  Her mother had breast cancer diagnosed in her sixties.  Her only sister had ovarian cancer diagnosed in her 60s and died from that disease.  The patient's mother is alive.   The patient has one brother and one sister, both in good health.  The family is aware of the patient's  genetic results and they are being tested.  GYNECOLOGIC HISTORY: The patient is GX P0 she never took oral contraceptives; s/p BSO November 2011 with benign pathology  SOCIAL HISTORY: (updated NOV 2013) She works part time at [employer] in Clinical biochemist.  Her husband, Alycia Rossetti, used to work for [employer] in [employer], but now they have opened their own business [employer], working primarily on [name] cars.  They have no  children.  They have a dog and a cat at home.  The patient is not a church attender.     ADVANCED DIRECTIVES:  HEALTH MAINTENANCE: History  Substance Use Topics  . Smoking status: Not on file  . Smokeless tobacco: Not on file  . Alcohol Use:      Colonoscopy:  PAP: UTD/Mezer  Bone density:  Lipid panel:  Allergies  Allergen Reactions  . Neomycin-Bacitracin Zn-Polymyx Rash    Current Outpatient Prescriptions  Medication Sig Dispense Refill  . Calcium Carbonate-Vitamin D (CALCIUM 600 + D PO) Take 1 tablet by mouth daily.        . cetirizine (ZYRTEC) 10 MG tablet Take 10 mg by mouth daily.        Marland Kitchen gabapentin (NEURONTIN) 100 MG capsule Take 1 capsule (100 mg total) by mouth at bedtime.  90 capsule  12  . SYNTHROID 175 MCG tablet         OBJECTIVE: Young-appearing white woman who appears healthy Filed Vitals:   11/26/11 1054  BP: 116/82  Pulse: 93  Temp: 98.5 F (36.9 C)  Resp: 20     Body mass index is 37.30 kg/(m^2).    ECOG FS: 0  Sclerae unicteric Oropharynx clear No peripheral adenopathy Lungs no rales or rhonchi Heart regular rate and rhythm Abd benign MSK no focal spinal tenderness, no peripheral edema Neuro: nonfocal Breasts: Right breast is unremarkable. Left breast is status post lumpectomy and radiation, with no evidence of local recurrence; the left axilla is benign  LAB RESULTS: Lab Results  Component Value Date   WBC 5.5 11/19/2011   NEUTROABS 2.9 11/19/2011   HGB 13.5 11/19/2011   HCT 40.0 11/19/2011   MCV 87.9 11/19/2011   PLT 288 11/19/2011      Chemistry      Component Value Date/Time   NA 140 11/19/2011 0836   NA 141 05/20/2011 1241   NA 140 11/06/2010 1455   K 4.4 11/19/2011 0836   K 4.1 05/20/2011 1241   K 3.8 11/06/2010 1455   CL 107 11/19/2011 0836   CL 96* 05/20/2011 1241   CL 102 11/06/2010 1455   CO2 28 11/19/2011 0836   CO2 28 05/20/2011 1241   CO2 30 11/06/2010 1455   BUN 14.0 11/19/2011 0836   BUN 13 05/20/2011 1241    BUN 11 11/06/2010 1455   CREATININE 0.8 11/19/2011 0836   CREATININE 0.6 05/20/2011 1241   CREATININE 0.83 11/06/2010 1455      Component Value Date/Time   CALCIUM 9.5 11/19/2011 0836   CALCIUM 8.6 05/20/2011 1241   CALCIUM 9.8 11/06/2010 1455   ALKPHOS 77 11/19/2011 0836   ALKPHOS 83 05/20/2011 1241   ALKPHOS 99 11/06/2010 1455   AST 13 11/19/2011 0836   AST 18 05/20/2011 1241   AST 15 11/06/2010 1455   ALT 18 11/19/2011 0836   ALT 16 11/06/2010 1455   BILITOT 0.51 11/19/2011 0836   BILITOT 0.60 05/20/2011 1241   BILITOT 0.1* 11/06/2010 1455  Lab Results  Component Value Date   LABCA2 19 11/06/2010   LABCA2 19 11/06/2010    No components found with this basename: LABCA125    No results found for this basename: INR:1;PROTIME:1 in the last 168 hours  Urinalysis    Component Value Date/Time   COLORURINE YELLOW 11/30/2008 0914   APPEARANCEUR CLEAR 11/30/2008 0914   LABSPEC 1.020 07/03/2009 1151   LABSPEC 1.022 11/30/2008 0914   PHURINE 5.0 11/30/2008 0914   GLUCOSEU NEGATIVE 11/30/2008 0914   HGBUR NEGATIVE 11/30/2008 0914   BILIRUBINUR NEGATIVE 11/30/2008 0914   KETONESUR NEGATIVE 11/30/2008 0914   PROTEINUR NEGATIVE 11/30/2008 0914   UROBILINOGEN 0.2 11/30/2008 0914   NITRITE NEGATIVE 11/30/2008 0914   LEUKOCYTESUR NEGATIVE MICROSCOPIC NOT DONE ON URINES WITH NEGATIVE PROTEIN, BLOOD, LEUKOCYTES, NITRITE, OR GLUCOSE <1000 mg/dL. 11/30/2008 0914    STUDIES: Mr Breast Bilateral W Wo Contrast  11/01/2011  *RADIOLOGY REPORT*  Clinical Data: 36 year old  female with history of left breast cancer status post lumpectomy 2010.  The patient is BRCA positive.  BILATERAL BREAST MRI WITH AND WITHOUT CONTRAST  Technique: Multiplanar, multisequence MR images of both breasts were obtained prior to and following the intravenous administration of 20ml of multihance.  Three dimensional images were evaluated at the independent DynaCad workstation.  Comparison:  Prior MRIs dated  10/29/2010 and 10/27/2009.  Mammogram dated 11/01/2011.  Findings: There is a moderate background parenchymal enhancement pattern.  Lumpectomy changes are seen in the left breast.  There is no suspicious enhancement in either breast.  There is no enlarged axillary, or internal mammary adenopathy.  IMPRESSION: Lumpectomy changes, left breast.  No abnormal enhancement  in either breast.  RECOMMENDATION: Yearly screening mammography and MRI would be recommended in this patient who is BRCA positive.  THREE-DIMENSIONAL MR IMAGE RENDERING ON INDEPENDENT WORKSTATION:  Three-dimensional MR images were rendered by post-processing of the original MR data on an independent workstation.  The three- dimensional MR images were interpreted, and findings were reported in the accompanying complete MRI report for this study.  BI-RADS CATEGORY 2:  Benign finding(s).   Original Report Authenticated By: Littie Deeds. Judyann Munson, M.D.    Mm Digital Diagnostic Bilat  11/01/2011  *RADIOLOGY REPORT*  Clinical Data:  Malignant lumpectomy of the left breast in 2010 with subsequent radiation therapy and chemotherapy.  Annual reevaluation.  DIGITAL DIAGNOSTIC BILATERAL MAMMOGRAM WITH CAD  Comparison:  10/29/2010, 10/27/2009, dating back to 10/26/2008.  Findings:  CC and MLO views of both breasts and a spot tangential view of the left breast at the lumpectomy site were obtained. Scattered fibroglandular breast tissue, unchanged.  Post lumpectomy scarring in the upper left breast.  No new suspicious mass, nonsurgical architectural distortion, or suspicious calcifications in either breast. Mammographic images were processed with CAD.  IMPRESSION: No specific mammographic evidence of malignancy.  Post lumpectomy scarring in the upper left breast.  RECOMMENDATION: Bilateral diagnostic mammography in 1 year  The patient was encouraged to perform monthly self breast examination and communicate any changes with her primary physician. I have discussed the  findings and recommendations with the patient. Results were also provided in writing at the conclusion of the visit.  BI-RADS CATEGORY 2:  Benign finding(s).   Original Report Authenticated By: Arnell Sieving, M.D.    Ct Soft Tissue Neck W Contrast  05/20/2011  *RADIOLOGY REPORT*  Clinical Data: 36 year old female with history of breast cancer and thyroid cancer.  Chemotherapy and radiation complete.  Status post thyroidectomy and radioactive iodine ablation (2011).  CT NECK WITH CONTRAST  Technique:  Multidetector CT imaging of the neck was performed with intravenous contrast.  Contrast: OMNIPAQUE IOHEXOL 300 MG/ML  SOLN In conjunction with CT(s) of the chest which is(are) reported separately.  Comparison: PET CT 05/14/2010 and earlier.  Findings: Chest findings are reported carefully.  Surgical clips at the thyroid resection site with no residual thyroid parenchyma evident by CT.  Small level IV and other thoracic inlet lymph nodes are within normal limits.  No cervical lymphadenopathy.  Negative larynx, pharynx, parapharyngeal spaces, retropharyngeal space, sublingual space, submandibular glands and parotid glands. Negative visualized brain parenchyma and orbit soft tissues. Visualized paranasal sinuses and mastoids are clear.  Major vascular structures are patent; postoperative changes to the lower right internal jugular vein are suspected with collateral venous flow. No acute osseous abnormality identified.  IMPRESSION: 1.  No residual thyroid parenchyma, lymphadenopathy, or acute finding in the neck. 2.  Chest findings are reported separately.  Original Report Authenticated By: Harley Hallmark, M.D.   Ct Chest W Contrast  05/20/2011  *RADIOLOGY REPORT*  Clinical Data: Breast cancer  CT CHEST WITH CONTRAST  Technique:  Multidetector CT imaging of the chest was performed following the standard protocol during bolus administration of intravenous contrast.  Contrast: OMNIPAQUE IOHEXOL 300  MG/ML  SOLN  Comparison: 05/14/2010  Findings:  There is no enlarged axillary or supraclavicular adenopathy.  No internal mammary lymph node is identified.    There is no mediastinal or hilar adenopathy.  No pericardial or pleural effusion.  The esophagus appears normal.  The patient is status post thyroidectomy.  The tracheobronchial tree is patent.  Tiny nonspecific nodule in the right apex measures 3.7 mm, image #8.  Within the  anterior right upper upper lobe there is a 3.4 mm nodule, image 24.  Review of the visualized bony structures is negative.  There are no aggressive lytic or sclerotic lesions identified.  IMPRESSION:  1.  No acute findings and no evidence for metastatic disease. 2.  Tiny nonspecific pulmonary nodules in the right lung measure up to 3.7 mm.  If the patient is at high risk for bronchogenic carcinoma, follow-up chest CT at 1 year is recommended.  If the patient is at low risk, no follow-up is needed.  This recommendation follows the consensus statement: Guidelines for Management of Small Pulmonary Nodules Detected on CT Scans:  A Statement from the Fleischner Society as published in Radiology 2005; 237:395-400.  Original Report Authenticated By: Rosealee Albee, M.D.    ASSESSMENT: 36 year old BRCA-1 positive [place] woman    (1) status post post left lumpectomy and sentinel lymph node dissection November of 2010 for a T2 N1(mic), stage IB invasive ductal carcinoma, triple negative, with an MIB-1 of 78%, treated adjuvantly with carboplatin and paclitaxel x3 followed by dose dense doxorubicin and cyclophosphamide x4 followed by radiation completed August of 2011   (2) papillary thyroid carcinoma post total thyroidectomy in January of 2011 with 5 of 6 lymph nodes involved, status post radioiodine ablation, with post treatment hypothyroidism, followed through doctor Kerr's office   (3) s/p BSO November 2011 with benign pathology  PLAN: Elis is doing well from a breast cancer  point of view. She understands the need for exercise a goal being 45 minutes 5 times a week, and she and Alycia Rossetti will be working on getting a home gym set up soon, she tells me. Otherwise her papillary thyroid carcinoma is followed through Dr. Sharl Ma, and her history of melanoma through  Dr. Danella Deis. She will see [name] again in 6 months with physical exam and lab work only, and then 12 months with the same plus mammography and repeat breast MRI. She knows to call for any unusual symptom that may develop before the next visit.   MAGRINAT,GUSTAV C    11/26/2011

## 2012-05-25 ENCOUNTER — Other Ambulatory Visit (HOSPITAL_BASED_OUTPATIENT_CLINIC_OR_DEPARTMENT_OTHER): Payer: BC Managed Care – PPO | Admitting: Lab

## 2012-05-25 DIAGNOSIS — C50919 Malignant neoplasm of unspecified site of unspecified female breast: Secondary | ICD-10-CM

## 2012-05-25 DIAGNOSIS — C73 Malignant neoplasm of thyroid gland: Secondary | ICD-10-CM

## 2012-05-25 LAB — CBC WITH DIFFERENTIAL/PLATELET
BASO%: 0.6 % (ref 0.0–2.0)
EOS%: 2 % (ref 0.0–7.0)
HCT: 39.2 % (ref 34.8–46.6)
MCHC: 33.6 g/dL (ref 31.5–36.0)
MONO#: 0.4 10*3/uL (ref 0.1–0.9)
NEUT%: 56.9 % (ref 38.4–76.8)
RBC: 4.46 10*6/uL (ref 3.70–5.45)
RDW: 13 % (ref 11.2–14.5)
WBC: 6.9 10*3/uL (ref 3.9–10.3)
lymph#: 2.4 10*3/uL (ref 0.9–3.3)

## 2012-05-25 LAB — COMPREHENSIVE METABOLIC PANEL (CC13)
ALT: 15 U/L (ref 0–55)
AST: 13 U/L (ref 5–34)
Albumin: 3.5 g/dL (ref 3.5–5.0)
CO2: 29 mEq/L (ref 22–29)
Calcium: 9.1 mg/dL (ref 8.4–10.4)
Chloride: 103 mEq/L (ref 98–107)
Potassium: 3.6 mEq/L (ref 3.5–5.1)
Sodium: 140 mEq/L (ref 136–145)
Total Protein: 7.1 g/dL (ref 6.4–8.3)

## 2012-06-02 ENCOUNTER — Telehealth: Payer: Self-pay | Admitting: Oncology

## 2012-06-02 ENCOUNTER — Other Ambulatory Visit: Payer: Self-pay | Admitting: Oncology

## 2012-06-02 ENCOUNTER — Ambulatory Visit (HOSPITAL_BASED_OUTPATIENT_CLINIC_OR_DEPARTMENT_OTHER): Payer: BC Managed Care – PPO | Admitting: Physician Assistant

## 2012-06-02 ENCOUNTER — Encounter: Payer: Self-pay | Admitting: Physician Assistant

## 2012-06-02 VITALS — BP 119/75 | HR 99 | Temp 97.2°F | Resp 20 | Ht 66.0 in | Wt 234.8 lb

## 2012-06-02 DIAGNOSIS — C73 Malignant neoplasm of thyroid gland: Secondary | ICD-10-CM

## 2012-06-02 DIAGNOSIS — Z1501 Genetic susceptibility to malignant neoplasm of breast: Secondary | ICD-10-CM

## 2012-06-02 DIAGNOSIS — C50919 Malignant neoplasm of unspecified site of unspecified female breast: Secondary | ICD-10-CM

## 2012-06-02 DIAGNOSIS — Z1509 Genetic susceptibility to other malignant neoplasm: Secondary | ICD-10-CM

## 2012-06-02 DIAGNOSIS — C50912 Malignant neoplasm of unspecified site of left female breast: Secondary | ICD-10-CM

## 2012-06-02 NOTE — Progress Notes (Signed)
ID: Audrey Chambers   DOB: July 28, 1975  MR#: [id number]  WUJ#:[id number]  PCP: Hollice Espy, MD GYN: Teodora Medici SU: OTHER MD: Campbell Stall, Debara Pickett   HISTORY OF PRESENT ILLNESS: In mid October of 2010 [name] palpated a mass in her left breast. She brought this to Dr. Pauline Good attention and he set her up for bilateral mammography October of 20 can Dr. Jean Rosenthal was able to palpate a mobile nodule in the left breast at the 12:00 position which by mammography showed up as subtle questionable asymmetry. By ultrasound there was an irregular hypoechoic mass measuring 1.7 cm. Biopsy was performed the same day and showed (left eye [id number] and p.m. 10-731) an invasive mammary carcinoma which was estrogen and progesterone receptor negative with an MIB-1 of 78% and no HER-2 amplification, the ratio being 0.91.  With this information the patient was referred to Dr. Derrell Lolling and bilateral breast MRIs were obtained October 28 this showed a solitary mass in the left breast measuring 2.2 cm with no evidence of internal mammary or axillary lymph node positivity. At this point the patient was set up for genetic studies. She proved to be BRCA-1 positive. Scans showed no evidence of metastatic disease otherwise a right thyroid nodule noted at that time.  With this information the patient proceeded to left partial mastectomy with axillary lymph node sampling November 2010. The final pathology (S. CEA 10-137) showed a 2.1 cm invasive ductal carcinoma with negative and ample margins but with evidence of lymphovascular invasion. One out of 3 sentinel lymph nodes was involved by micrometastatic deposit. The tumor was grade 3  Her subsequent history is as detailed below  INTERVAL HISTORY: Audrey Chambers returns today with her husband Alycia Rossetti for followup of her  left breast cancer. 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 Physically, Audrey Chambers is feeling well, but  admits that she is not exercising as much as she should. She still has some residual neuropathy in the lower extremities although this is not affecting any of her day-to-day activities. She does find it helpful to take a low dose of gabapentin, 100 mg, at night.  REVIEW OF SYSTEMS: Audrey Chambers has had no recent illnesses, and denies fevers or chills. She has occasional hot flashes, but these are infrequent. She's had no problems with vaginal dryness or discharge, and denies any abnormal vaginal bleeding. Her energy level is fair. Her appetite is good and she denies any nausea or change in bowel or bladder habits. She's had no cough, phlegm production, shortness of breath, chest pain, or palpitations. She denies any abnormal headaches, and has no unusual myalgias, arthralgias, or bony pain.  A detailed review of systems is otherwise noncontributory.   PAST MEDICAL HISTORY: Past Medical History  Diagnosis Date  . Breast CA     breast ca dx 11/10  . Melanoma of back 2002    resolved  Significant for melanoma removal approximately 10 years ago from the upper back.  Dr. Campbell Stall did the original excision and Dr. Francina Ames did the deeper surgery.  The patient tells me this was "superficial" and certainly being more than 10 years ago, this is almost certainly cured.  Polycystic ovaries. Status post exploratory laparotomy for what sounds like it may have been mild endometriosis.    PAST SURGICAL HISTORY: Past Surgical History  Procedure Laterality Date  . Exploratory laparotomy  remote    for possible endometriosis  . Thyroidectomy  January 2011  for papillary thyroid CA    FAMILY HISTORY Family History  Problem Relation Age of Onset  . Breast cancer Mother 48    survived  . Ovarian cancer Sister 26    died  The patient's father is alive at age 13.  Her mother had breast cancer diagnosed in her sixties.  Her only sister had ovarian cancer diagnosed in her 39s and died from that  disease.  The patient's mother is alive.   The patient has one brother and one sister, both in good health.  The family is aware of the patient's genetic results and they are being tested.  GYNECOLOGIC HISTORY: The patient is GX P0 she never took oral contraceptives; s/p BSO November 2011 with benign pathology  SOCIAL HISTORY: (updated NOV 2013) She works part time at [employer] in Clinical biochemist.  Her husband, Alycia Rossetti, used to work for [employer] in [employer], but now they have opened their own business [employer], working primarily on [name] cars.  They have no children.  They have a dog and a cat at home.  The patient is not a church attender.     ADVANCED DIRECTIVES:  HEALTH MAINTENANCE: History  Substance Use Topics  . Smoking status: Never Smoker   . Smokeless tobacco: Never Used  . Alcohol Use: No     Colonoscopy:  PAP: UTD/Mezer  Bone density:  Lipid panel:  Allergies  Allergen Reactions  . Neomycin-Bacitracin Zn-Polymyx Rash    Current Outpatient Prescriptions  Medication Sig Dispense Refill  . Calcium Carbonate-Vitamin D (CALCIUM 600 + D PO) Take 1 tablet by mouth daily.        . cetirizine (ZYRTEC) 10 MG tablet Take 10 mg by mouth daily.        Marland Kitchen SYNTHROID 175 MCG tablet       . gabapentin (NEURONTIN) 100 MG capsule TAKE 1 CAPSULE BY MOUTH EVERY NIGHT AT BEDTIME  90 capsule  5   No current facility-administered medications for this visit.    OBJECTIVE: Young-appearing white woman who appears healthy Filed Vitals:   06/02/12 1013  BP: 119/75  Pulse: 99  Temp: 97.2 F (36.2 C)  Resp: 20     Body mass index is 37.92 kg/(m^2).    ECOG FS: 0 Filed Weights   06/02/12 1013  Weight: 234 lb 12.8 oz (106.505 kg)    Sclerae unicteric Oropharynx clear No peripheral adenopathy Lungs clear to auscultation bilaterally, no wheezes, no rales or rhonchi Heart regular rate and rhythm, no murmurs Abdomen  Obese, soft, nontender to  palpation, positive bowel sounds MSK no focal spinal tenderness to palpation, no peripheral edema Neuro: nonfocal, well oriented, with positive affect Breasts: Right breast is unremarkable. Left breast is status post lumpectomy and radiation, with no evidence of local recurrence.  Axillae are benign bilaterally with no palpable adenopathy.   LAB RESULTS: Lab Results  Component Value Date   WBC 6.9 05/25/2012   NEUTROABS 3.9 05/25/2012   HGB 13.2 05/25/2012   HCT 39.2 05/25/2012   MCV 87.9 05/25/2012   PLT 296 05/25/2012      Chemistry      Component Value Date/Time   NA 140 05/25/2012 1457   NA 141 05/20/2011 1241   NA 140 11/06/2010 1455   K 3.6 05/25/2012 1457   K 4.1 05/20/2011 1241   K 3.8 11/06/2010 1455   CL 103 05/25/2012 1457   CL 96* 05/20/2011 1241   CL 102 11/06/2010 1455  CO2 29 05/25/2012 1457   CO2 28 05/20/2011 1241   CO2 30 11/06/2010 1455   BUN 12.1 05/25/2012 1457   BUN 13 05/20/2011 1241   BUN 11 11/06/2010 1455   CREATININE 0.8 05/25/2012 1457   CREATININE 0.6 05/20/2011 1241   CREATININE 0.83 11/06/2010 1455      Component Value Date/Time   CALCIUM 9.1 05/25/2012 1457   CALCIUM 8.6 05/20/2011 1241   CALCIUM 9.8 11/06/2010 1455   ALKPHOS 76 05/25/2012 1457   ALKPHOS 83 05/20/2011 1241   ALKPHOS 99 11/06/2010 1455   AST 13 05/25/2012 1457   AST 18 05/20/2011 1241   AST 15 11/06/2010 1455   ALT 15 05/25/2012 1457   ALT 16 11/06/2010 1455   BILITOT 0.37 05/25/2012 1457   BILITOT 0.60 05/20/2011 1241   BILITOT 0.1* 11/06/2010 1455       STUDIES:  No results found.  Most recent mammogram and breast MRI were in October 2013.   ASSESSMENT: 37 year old BRCA-1 positive [place] woman    (1) status post post left lumpectomy and sentinel lymph node dissection November of 2010 for a T2 N1(mic), stage IB invasive ductal carcinoma, triple negative, with an MIB-1 of 78%, treated adjuvantly with carboplatin and paclitaxel x3 followed by dose dense doxorubicin and  cyclophosphamide x4 followed by radiation completed August of 2011   (2) papillary thyroid carcinoma post total thyroidectomy in January of 2011 with 5 of 6 lymph nodes involved, status post radioiodine ablation, with post treatment hypothyroidism, followed through doctor Kerr's office   (3) s/p BSO November 2011 with benign pathology  PLAN: Adriannah is doing very well, with no clinical evidence of recurrence of her breast cancer. We will repeat her annual mammogram as well as her annual MRI in late October, and she will see Dr. Darnelle Catalan in November with repeat labs and physical exam. In the meanwhile, she'll continue on gabapentin 100 mg nightly for the mild neuropathy.  Shacara voices understanding and agreement with this plan, and will call with any changes or problems prior to her next appointment.  Her papillary thyroid carcinoma is followed through Dr. Sharl Ma, and her history of melanoma through Dr. Danella Deis.   Nyxon Strupp    06/02/2012

## 2012-10-05 ENCOUNTER — Other Ambulatory Visit: Payer: Self-pay | Admitting: Internal Medicine

## 2012-10-05 DIAGNOSIS — C73 Malignant neoplasm of thyroid gland: Secondary | ICD-10-CM

## 2012-10-06 ENCOUNTER — Other Ambulatory Visit: Payer: BC Managed Care – PPO

## 2012-10-08 ENCOUNTER — Ambulatory Visit
Admission: RE | Admit: 2012-10-08 | Discharge: 2012-10-08 | Disposition: A | Discharge status: Home / Self Care | Payer: BC Managed Care – PPO | Source: Ambulatory Visit | Attending: Internal Medicine | Admitting: Internal Medicine

## 2012-10-08 DIAGNOSIS — C73 Malignant neoplasm of thyroid gland: Secondary | ICD-10-CM

## 2012-11-03 ENCOUNTER — Ambulatory Visit
Admission: RE | Admit: 2012-11-03 | Discharge: 2012-11-03 | Disposition: A | Discharge status: Home / Self Care | Payer: BC Managed Care – PPO | Source: Ambulatory Visit | Attending: Oncology | Admitting: Oncology

## 2012-11-03 ENCOUNTER — Other Ambulatory Visit: Payer: Self-pay | Admitting: Physician Assistant

## 2012-11-03 ENCOUNTER — Ambulatory Visit
Admission: RE | Admit: 2012-11-03 | Discharge: 2012-11-03 | Disposition: A | Discharge status: Home / Self Care | Payer: BC Managed Care – PPO | Source: Ambulatory Visit | Attending: Physician Assistant | Admitting: Physician Assistant

## 2012-11-03 DIAGNOSIS — C50912 Malignant neoplasm of unspecified site of left female breast: Secondary | ICD-10-CM

## 2012-11-03 DIAGNOSIS — Z1501 Genetic susceptibility to malignant neoplasm of breast: Secondary | ICD-10-CM

## 2012-11-03 DIAGNOSIS — C50919 Malignant neoplasm of unspecified site of unspecified female breast: Secondary | ICD-10-CM

## 2012-11-03 MED ORDER — GADOBENATE DIMEGLUMINE 529 MG/ML IV SOLN
20.0000 mL | Freq: Once | INTRAVENOUS | Status: AC | PRN
  Administered 2012-11-03: 20 mL via INTRAVENOUS

## 2012-11-19 ENCOUNTER — Other Ambulatory Visit (HOSPITAL_BASED_OUTPATIENT_CLINIC_OR_DEPARTMENT_OTHER): Payer: BC Managed Care – PPO

## 2012-11-19 ENCOUNTER — Encounter (INDEPENDENT_AMBULATORY_CARE_PROVIDER_SITE_OTHER): Payer: Self-pay

## 2012-11-19 DIAGNOSIS — Z1501 Genetic susceptibility to malignant neoplasm of breast: Secondary | ICD-10-CM

## 2012-11-19 DIAGNOSIS — C50919 Malignant neoplasm of unspecified site of unspecified female breast: Secondary | ICD-10-CM

## 2012-11-19 DIAGNOSIS — C50912 Malignant neoplasm of unspecified site of left female breast: Secondary | ICD-10-CM

## 2012-11-19 LAB — CBC WITH DIFFERENTIAL/PLATELET
Basophils Absolute: 0.1 10*3/uL (ref 0.0–0.1)
EOS%: 2.1 % (ref 0.0–7.0)
HCT: 40.8 % (ref 34.8–46.6)
HGB: 13.8 g/dL (ref 11.6–15.9)
LYMPH%: 34.3 % (ref 14.0–49.7)
MCH: 30.1 pg (ref 25.1–34.0)
MONO#: 0.4 10*3/uL (ref 0.1–0.9)
NEUT%: 55.9 % (ref 38.4–76.8)
Platelets: 289 10*3/uL (ref 145–400)
WBC: 6.1 10*3/uL (ref 3.9–10.3)
lymph#: 2.1 10*3/uL (ref 0.9–3.3)

## 2012-11-19 LAB — COMPREHENSIVE METABOLIC PANEL (CC13)
Anion Gap: 9 mEq/L (ref 3–11)
BUN: 10.9 mg/dL (ref 7.0–26.0)
CO2: 26 mEq/L (ref 22–29)
Calcium: 9.4 mg/dL (ref 8.4–10.4)
Chloride: 105 mEq/L (ref 98–109)
Creatinine: 0.8 mg/dL (ref 0.6–1.1)
Total Bilirubin: 0.52 mg/dL (ref 0.20–1.20)

## 2012-11-26 ENCOUNTER — Telehealth: Payer: Self-pay | Admitting: Oncology

## 2012-11-26 ENCOUNTER — Ambulatory Visit (HOSPITAL_BASED_OUTPATIENT_CLINIC_OR_DEPARTMENT_OTHER): Payer: BC Managed Care – PPO | Admitting: Oncology

## 2012-11-26 VITALS — BP 116/81 | HR 103 | Temp 98.1°F | Resp 18 | Ht 66.0 in | Wt 232.4 lb

## 2012-11-26 DIAGNOSIS — Z1501 Genetic susceptibility to malignant neoplasm of breast: Secondary | ICD-10-CM

## 2012-11-26 DIAGNOSIS — C50919 Malignant neoplasm of unspecified site of unspecified female breast: Secondary | ICD-10-CM

## 2012-11-26 DIAGNOSIS — C4359 Malignant melanoma of other part of trunk: Secondary | ICD-10-CM

## 2012-11-26 DIAGNOSIS — Z171 Estrogen receptor negative status [ER-]: Secondary | ICD-10-CM

## 2012-11-26 DIAGNOSIS — C73 Malignant neoplasm of thyroid gland: Secondary | ICD-10-CM

## 2012-11-26 DIAGNOSIS — C50412 Malignant neoplasm of upper-outer quadrant of left female breast: Secondary | ICD-10-CM | POA: Insufficient documentation

## 2012-11-26 NOTE — Addendum Note (Signed)
Addended by: Billey Co on: 11/26/2012 05:08 PM   Modules accepted: Medications

## 2012-11-26 NOTE — Progress Notes (Signed)
ID: Robb Matar   DOB: Nov 30, 1975  MR#: [id number]  ZHY#:[id number]  PCP: Hollice Espy, MD GYN: Teodora Medici SU: OTHER MD: Campbell Stall, Debara Pickett   HISTORY OF PRESENT ILLNESS: In mid October of 2010 Audrey Chambers palpated a mass in her left breast. She brought this to Dr. Corwin Levins attention and he set her up for bilateral mammography October of 2010.  Dr. Jean Rosenthal was able to palpate a mobile nodule in the left breast at the 12:00 position which by mammography showed up as subtle questionable asymmetry. By ultrasound there was an irregular hypoechoic mass measuring 1.7 cm. Biopsy was performed the same day and showed ([id number]) an invasive mammary carcinoma which was estrogen and progesterone receptor negative, with an MIB-1 of 78% and no HER-2 amplification, the ratio being 0.91.   With this information the patient was referred to Dr. Derrell Lolling and bilateral breast MRIs were obtained November 03, 2008. This showed a solitary mass in the left breast measuring 2.2 cm with no evidence of internal mammary or axillary lymph node positivity. At this point the patient was set up for genetic studies. She proved to be BRCA-1 positive. Scans showed no evidence of metastatic disease otherwise a right thyroid nodule noted at that time.  With this information the patient proceeded to left partial mastectomy with axillary lymph node sampling November 2010. The final pathology (S. [id number]) showed a 2.1 cm invasive ductal carcinoma with negative and ample margins but with evidence of lymphovascular invasion. One out of 3 sentinel lymph nodes was involved by micrometastatic deposit. The tumor was grade 3  Her subsequent history is as detailed below  INTERVAL HISTORY: Audrey Chambers returns today with her husband Audrey Chambers for followup of her left breast cancer. The interval history is unremarkable. She and her husband are working hard at [employer]. She does a little walking back-and-forth fair but she does not do any  other regular exercise.  REVIEW OF SYSTEMS: Audrey Chambers denies any unusual headaches, visual changes, nausea, vomiting, stiff neck, dizziness, or gait imbalance. There has been no pain, fever, bleeding, rash, or unexplained weight loss or fatigue. A detailed review of systems today was entirely negative  PAST MEDICAL HISTORY: Past Medical History  Diagnosis Date  . Breast CA     breast ca dx 11/10  . Melanoma of back 2002    resolved  Significant for melanoma removal approximately 10 years ago from the upper back.  Dr. Campbell Stall did the original excision and Dr. Francina Ames did the deeper surgery.  The patient tells me this was "superficial" and certainly being more than 10 years ago, this is almost certainly cured.  Polycystic ovaries. Status post exploratory laparotomy for what sounds like it may have been mild endometriosis.    PAST SURGICAL HISTORY: Past Surgical History  Procedure Laterality Date  . Exploratory laparotomy  remote    for possible endometriosis  . Thyroidectomy  January 2011    for papillary thyroid CA    FAMILY HISTORY Family History  Problem Relation Age of Onset  . Breast cancer Mother 44    survived  . Ovarian cancer Sister 72    died  The patient's father is alive at age 32.  Her mother had breast cancer diagnosed in her sixties.  Her only sister had ovarian cancer diagnosed in her 56s and died from that disease.  The patient's mother is alive.   The patient has one brother and one sister, both in good health.  The family is  aware of the patient's genetic results and they are being tested.  GYNECOLOGIC HISTORY: The patient is GX P0 she never took oral contraceptives; s/p BSO November 2011 with benign pathology  SOCIAL HISTORY: (updated NOV 2013) She works part time at [employer] in Clinical biochemist.  Her husband, Audrey Chambers, used to work for [employer] in [employer], but now they have opened their own business [employer],  working primarily on [name] cars.  They have no children.  They have a dog and a cat at home.  The patient is not a church attender.     ADVANCED DIRECTIVES:  HEALTH MAINTENANCE: History  Substance Use Topics  . Smoking status: Never Smoker   . Smokeless tobacco: Never Used  . Alcohol Use: No     Colonoscopy:  PAP: UTD/Mezer  Bone density:  Lipid panel:  Allergies  Allergen Reactions  . Neomycin-Bacitracin Zn-Polymyx Rash    Current Outpatient Prescriptions  Medication Sig Dispense Refill  . Calcium Carbonate-Vitamin D (CALCIUM 600 + D PO) Take 1 tablet by mouth daily.        . cetirizine (ZYRTEC) 10 MG tablet Take 10 mg by mouth daily.        Marland Kitchen gabapentin (NEURONTIN) 100 MG capsule TAKE 1 CAPSULE BY MOUTH EVERY NIGHT AT BEDTIME  90 capsule  5  . SYNTHROID 175 MCG tablet        No current facility-administered medications for this visit.    OBJECTIVE: Young-appearing white woman in no acute distress Filed Vitals:   11/26/12 0908  BP: 116/81  Pulse: 103  Temp: 98.1 F (36.7 C)  Resp: 18     Body mass index is 37.53 kg/(m^2).    ECOG FS: 0 Filed Weights   11/26/12 0908  Weight: 232 lb 6.4 oz (105.416 kg)    Sclerae unicteric Oropharynx clear and moist No cervical or supraclavicular adenopathy Lungs clear to auscultation bilaterally, no wheezes, no rales or rhonchi Heart regular rate and rhythm, no murmurs Abdomen soft, nontender, positive bowel sounds  MSK no focal spinal tenderness, no upper extremity lymphedema Neuro: nonfocal, well oriented, with positive affect Breasts: Right breast is unremarkable. Left breast is status post lumpectomy and radiation, with no evidence of local recurrence. The left axilla is benign  LAB RESULTS: Lab Results  Component Value Date   WBC 6.1 11/19/2012   NEUTROABS 3.4 11/19/2012   HGB 13.8 11/19/2012   HCT 40.8 11/19/2012   MCV 88.8 11/19/2012   PLT 289 11/19/2012      Chemistry      Component Value Date/Time   NA 140  11/19/2012 0934   NA 141 05/20/2011 1241   NA 140 11/06/2010 1455   K 4.3 11/19/2012 0934   K 4.1 05/20/2011 1241   K 3.8 11/06/2010 1455   CL 103 05/25/2012 1457   CL 96* 05/20/2011 1241   CL 102 11/06/2010 1455   CO2 26 11/19/2012 0934   CO2 28 05/20/2011 1241   CO2 30 11/06/2010 1455   BUN 10.9 11/19/2012 0934   BUN 13 05/20/2011 1241   BUN 11 11/06/2010 1455   CREATININE 0.8 11/19/2012 0934   CREATININE 0.6 05/20/2011 1241   CREATININE 0.83 11/06/2010 1455      Component Value Date/Time   CALCIUM 9.4 11/19/2012 0934   CALCIUM 8.6 05/20/2011 1241   CALCIUM 9.8 11/06/2010 1455   ALKPHOS 74 11/19/2012 0934   ALKPHOS 83 05/20/2011 1241   ALKPHOS 99 11/06/2010 1455   AST  15 11/19/2012 0934   AST 18 05/20/2011 1241   AST 15 11/06/2010 1455   ALT 18 11/19/2012 0934   ALT 18 05/20/2011 1241   ALT 16 11/06/2010 1455   BILITOT 0.52 11/19/2012 0934   BILITOT 0.60 05/20/2011 1241   BILITOT 0.1* 11/06/2010 1455       STUDIES:  Mr Breast Bilateral W Wo Contrast  11/03/2012   CLINICAL DATA:  The patient has a history of left breast malignant lumpectomy in November of 2010 for a triple negative grade 3 left breast invasive ductal carcinoma. She underwent radiation therapy and adjuvant chemotherapy. There was a micrometastasis in 1 of 3 sentinel nodes. Marland Kitchen  EXAM: MR BILATERAL BREAST WITHOUT AND WITH CONTRAST  TECHNIQUE: Multiplanar, multisequence MR images of both breasts were obtained prior to and following the intravenous administration of 20ml of MultiHance.  THREE-DIMENSIONAL MR IMAGE RENDERING ON INDEPENDENT WORKSTATION:  Three-dimensional MR images were rendered by post-processing of the original MR data on an independent workstation. The three-dimensional MR images were interpreted, and findings are reported in the following complete MRI report for this study.  COMPARISON:  11/03/2012 mammogram and 11/01/2011 breast MRI.  FINDINGS: Breast composition: 0: Incomplete. Need additional imaging  evaluation and/or prior mammograms for comparison. Be  Background parenchymal enhancement: Mild  Right breast: No mass or abnormal enhancement.  Left breast: There is no mass or abnormal enhancement. There are mild post lumpectomy changes present which are stable.  Lymph nodes: No abnormal appearing lymph nodes.  Ancillary findings:  None.  IMPRESSION: No findings worrisome for recurrent tumor or developing malignancy.  RECOMMENDATION: Annual diagnostic mammography and consider annual screening breast MRI.  BI-RADS CATEGORY  1: Negative   Electronically Signed   By: Anselmo Pickler M.D.   On: 11/03/2012 14:45   Mm Digital Diagnostic Bilat  11/04/2012   CLINICAL DATA:  History of malignant lumpectomy of the left breast in 2010. Annual re-evaluation.  EXAM: DIGITAL DIAGNOSTIC  BILATERAL MAMMOGRAM  Mammographic images were processed with CAD.  COMPARISON:  Previous examinations  ACR Breast Density Category b: There are scattered areas of fibroglandular density.  FINDINGS: There are stable mild scarring changes related patient's previous left breast lumpectomy. There is no specific evidence for recurrent tumor or developing malignancy within either breast.  IMPRESSION: Stable parenchymal pattern. No findings worrisome for developing malignancy.  RECOMMENDATION: Bilateral diagnostic mammography in 1 year.  I have discussed the findings and recommendations with the patient. Results were also provided in writing at the conclusion of the visit. If applicable, a reminder letter will be sent to the patient regarding the next appointment.  BI-RADS CATEGORY  1: Negative   Electronically Signed   By: Anselmo Pickler M.D.   On: 11/04/2012 09:45   ASSESSMENT: 37 y.o.  BRCA-1 positive [place] woman    (1) status post post left lumpectomy and sentinel lymph node dissection November of 2010 for a T2 N1(mic), stage IB invasive ductal carcinoma, triple negative, with an MIB-1 of 78%, treated adjuvantly with carboplatin and  paclitaxel x3 followed by dose dense doxorubicin and cyclophosphamide x4 followed by radiation completed August of 2011   (2) papillary thyroid carcinoma post total thyroidectomy in January of 2011 with 5 of 6 lymph nodes involved, status post radioiodine ablation, with post treatment hypothyroidism, followed through doctor Kerr's office   (3) s/p BSO November 2011 with benign pathology  PLAN: Audrey Chambers is doing fine from a breast cancer point of view and at this point we are going to  start seeing her on a once a year basis, after her October MRI and mammography. I offered her "graduation" at 5 years, but she will like to be followed here at least for a total of 10 years. I encouraged her to visit with her primary care physician, since I am not checking her lipid panel for example. Of course she will continue to follow with Dr. Sharl Ma and Dr. Danella Deis regarding her to other cancers.  Audrey Chambers understands that when triple negative breast cancers recur, they tend to do so early. Accordingly with every year that passes her prognosis significantly improves. I am delighted to see her during this well. I did encourage her to exercise on a regular basis I think this will bring her heart rate and by the time she sees me again in one year.  Audrey Chambers C    11/26/2012

## 2013-01-25 ENCOUNTER — Other Ambulatory Visit: Payer: Self-pay | Admitting: Dermatology

## 2013-03-12 ENCOUNTER — Other Ambulatory Visit: Payer: Self-pay | Admitting: *Deleted

## 2013-03-12 DIAGNOSIS — R000 Tachycardia, unspecified: Secondary | ICD-10-CM

## 2013-03-12 NOTE — Progress Notes (Signed)
24 hr holter ordered by Dr A. Morrow for tachycardia, order placed under DOD Dr Johnsie Cancel for 03/16/13

## 2013-03-16 ENCOUNTER — Encounter (INDEPENDENT_AMBULATORY_CARE_PROVIDER_SITE_OTHER): Payer: BC Managed Care – PPO

## 2013-03-16 ENCOUNTER — Encounter: Payer: Self-pay | Admitting: *Deleted

## 2013-03-16 DIAGNOSIS — R000 Tachycardia, unspecified: Secondary | ICD-10-CM

## 2013-03-16 NOTE — Progress Notes (Signed)
Patient ID: Audrey Chambers, female   DOB: [birth year], 38 y.o.   MRN: [id number] E-Cardio 24 hour holter monitor applied to patient.

## 2013-06-28 ENCOUNTER — Other Ambulatory Visit: Payer: Self-pay | Admitting: Oncology

## 2013-06-28 DIAGNOSIS — C50919 Malignant neoplasm of unspecified site of unspecified female breast: Secondary | ICD-10-CM

## 2013-09-22 ENCOUNTER — Other Ambulatory Visit: Payer: Self-pay | Admitting: Oncology

## 2013-09-22 DIAGNOSIS — Z853 Personal history of malignant neoplasm of breast: Secondary | ICD-10-CM

## 2013-10-22 ENCOUNTER — Other Ambulatory Visit (HOSPITAL_COMMUNITY): Payer: Self-pay | Admitting: Cardiology

## 2013-10-22 DIAGNOSIS — C50919 Malignant neoplasm of unspecified site of unspecified female breast: Secondary | ICD-10-CM

## 2013-10-25 ENCOUNTER — Encounter (HOSPITAL_COMMUNITY): Payer: Self-pay

## 2013-10-25 ENCOUNTER — Ambulatory Visit (HOSPITAL_BASED_OUTPATIENT_CLINIC_OR_DEPARTMENT_OTHER)
Admission: RE | Admit: 2013-10-25 | Discharge: 2013-10-25 | Disposition: A | Discharge status: Home / Self Care | Payer: BC Managed Care – PPO | Source: Ambulatory Visit | Attending: Internal Medicine | Admitting: Internal Medicine

## 2013-10-25 ENCOUNTER — Ambulatory Visit (HOSPITAL_COMMUNITY)
Admission: RE | Admit: 2013-10-25 | Discharge: 2013-10-25 | Disposition: A | Discharge status: Home / Self Care | Payer: BC Managed Care – PPO | Source: Ambulatory Visit | Attending: Cardiology | Admitting: Cardiology

## 2013-10-25 VITALS — BP 108/64 | HR 95 | Ht 66.0 in | Wt 229.4 lb

## 2013-10-25 DIAGNOSIS — C50412 Malignant neoplasm of upper-outer quadrant of left female breast: Secondary | ICD-10-CM

## 2013-10-25 DIAGNOSIS — R000 Tachycardia, unspecified: Secondary | ICD-10-CM | POA: Insufficient documentation

## 2013-10-25 DIAGNOSIS — C50919 Malignant neoplasm of unspecified site of unspecified female breast: Secondary | ICD-10-CM

## 2013-10-25 DIAGNOSIS — R0600 Dyspnea, unspecified: Secondary | ICD-10-CM

## 2013-10-25 DIAGNOSIS — I519 Heart disease, unspecified: Secondary | ICD-10-CM

## 2013-10-25 DIAGNOSIS — I471 Supraventricular tachycardia: Secondary | ICD-10-CM

## 2013-10-25 DIAGNOSIS — R0609 Other forms of dyspnea: Secondary | ICD-10-CM

## 2013-10-25 LAB — PRO B NATRIURETIC PEPTIDE: PRO B NATRI PEPTIDE: 21.1 pg/mL (ref 0–125)

## 2013-10-25 MED ORDER — METOPROLOL TARTRATE 25 MG PO TABS
12.5000 mg | ORAL_TABLET | Freq: Two times a day (BID) | ORAL | Status: DC
Start: 2013-10-25 — End: 2013-11-25

## 2013-10-25 NOTE — Patient Instructions (Signed)
Start Metoprolol 12.5 mg (1/2 tab) Twice daily for 1 week, if it makes you feel better you may continue taking  Lab today  Your physician has recommended that you have a cardiopulmonary stress test (CPX). CPX testing is a non-invasive measurement of heart and lung function. It replaces a traditional treadmill stress test. This type of test provides a tremendous amount of information that relates not only to your present condition but also for future outcomes. This test combines measurements of you ventilation, respiratory gas exchange in the lungs, electrocardiogram (EKG), blood pressure and physical response before, during, and following an exercise protocol.  PLEASE TRY TO WALK EVERYDAY FOR 20-30 MINUTES  Your physician recommends that you schedule a follow-up appointment in: 3 months

## 2013-10-25 NOTE — Progress Notes (Signed)
  Echocardiogram 2D Echocardiogram has been performed.  Nekeshia Lenhardt FRANCES 10/25/2013, 9:28 AM

## 2013-10-25 NOTE — Progress Notes (Signed)
Patient ID: Audrey Chambers, female   DOB: November 12, 1975, 38 y.o.   MRN: [id number] PCP: Dr. Darcus Austin Oncology: Dr. Jana Hakim  38 yo with history of treated melanoma, papillary thyroid carcinoma, and breast cancer is seen for exertional dyspnea and rapid heart rate.  She completed treatment for breast cancer back in 8/11.  She is in remission with all her cancers.  She had doxorubicin-based chemo in 2011.  She did not get Herceptin (ER-/PR-/HER2-).    For the last 8-12 months, she has noted exertional dyspnea.  She is short of breath carrying a load her in arms or walking up stairs.  She tends to do ok on flat ground, but with anything more than mild exertion she is out of breath.  This pattern is stable.  No orthopnea/PND.  No chest pain.  No lightheadedness/syncope.  She also reports a rapid heart rate over the last year.  She had a holter done back in 3/15 that showed sinus tachycardia with average HR 104, no PACs, PVCs, or other arrhythmias.  She feels her heart beating fast at times.    Labs (11/14): K 4.3, creatinine 0.8  ECG: NSR, normal (94 bpm)  PMH: 1. Melanoma: Excised from back in 2002.  2. Endometriosis 3. Papillary thyroid carcinoma: 1/11, RAI with partial thyroidectomy. 4. Hypothyroidism 5. Breast cancer: On left, s/p lumpectomy + radiation.  ER-/PR-/HER2-.  BRCA1+.  Treated with carboplatin + paclitaxel x 3 cycles, then doxorubicin + cyclophosphamide x 4 cycles.  Treatment completed 8/11.  6. Sinus tachycardia: Holter (3/15) with average HR 104, range 71-151; no PACs/PVCs or other arrhythmias.  Echo (10/15) with EF 60-65%, normal except for grade I diastolic dysfunction, unable to estimate PA systolic pressure.   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    FH: Breast cancer mother, ovarian cancer sister, grandmother with "heart disease."    ROS: All systems reviewed and negative except as per HPI.   Current Outpatient  Prescriptions  Medication Sig Dispense Refill  . Calcium Carbonate-Vitamin D (CALCIUM 600 + D PO) Take 1 tablet by mouth daily.        . cetirizine (ZYRTEC) 10 MG tablet Take 10 mg by mouth daily.        . fluticasone (FLONASE) 50 MCG/ACT nasal spray       . gabapentin (NEURONTIN) 100 MG capsule TAKE 1 CAPSULE BY MOUTH EVERY NIGHT AT BEDTIME  90 capsule  1  . SYNTHROID 175 MCG tablet Take 150 mcg by mouth daily before breakfast.       . metoprolol tartrate (LOPRESSOR) 25 MG tablet Take 0.5 tablets (12.5 mg total) by mouth 2 (two) times daily.  30 tablet  3   No current facility-administered medications for this encounter.    BP 108/64  Pulse 95  Ht 5' 6"  (1.676 m)  Wt 229 lb 6.4 oz (104.055 kg)  BMI 37.04 kg/m2  SpO2 99% General: NAD, overweight Neck: No JVD, no thyromegaly or thyroid nodule.  Lungs: Clear to auscultation bilaterally with normal respiratory effort. CV: Nondisplaced PMI.  Heart mildly tachy, regular S1/S2, no S3/S4, no murmur.  No peripheral edema.  No carotid bruit.  Normal pedal pulses.  Abdomen: Soft, nontender, no hepatosplenomegaly, no distention.  Skin: Intact without lesions or rashes.  Neurologic: Alert and oriented x 3.  Psych: Normal affect. Extremities: No clubbing or cyanosis.  HEENT: Normal.   Assessment/Plan: 1. Exertional dyspnea: Dyspnea with moderate exertion x 1 year.  Echo is unremarkable (reviewed today) => no evidence for cardiotoxicity from prior doxorubicin use.  She does not appear volume overloaded on exam.  No history of smoking or lung disease.  It is possible that her symptoms are due to deconditioning.  - CPX to assess for evidence of cardiac or pulmonary dysfunction.  - Check BNP today.  - I asked her to increase her activity level, would like to see her walk 20-30 minutes/day.  2. Sinus tachycardia: Suspect inappropriate sinus tachycardia.  She feels her heart race at times.  Today, HR in the 100s for the most part (sinus rhythm).   Holter showed only sinus tachycardia.  I offered her a trial of a beta blocker to see if her symptoms improve any.  She will try metoprolol 12.5 mg bid to see if this makes a difference.  If no symptomatic improvement after a week or so, she can stop it.   Followup in 3 months.   Loralie Champagne 10/25/2013

## 2013-10-26 NOTE — Addendum Note (Signed)
Encounter addended by: Vanessa Barbara, CCT on: 10/26/2013  8:53 AM<BR>     Documentation filed: Charges VN

## 2013-11-02 ENCOUNTER — Encounter: Payer: Self-pay | Admitting: Hematology and Oncology

## 2013-11-02 NOTE — Progress Notes (Signed)
Faxed all October office notes to [facility] @ [place] [phone]

## 2013-11-04 ENCOUNTER — Ambulatory Visit
Admission: RE | Admit: 2013-11-04 | Discharge: 2013-11-04 | Disposition: A | Discharge status: Home / Self Care | Payer: BC Managed Care – PPO | Source: Ambulatory Visit | Attending: Oncology | Admitting: Oncology

## 2013-11-04 ENCOUNTER — Other Ambulatory Visit: Payer: BC Managed Care – PPO

## 2013-11-04 DIAGNOSIS — Z853 Personal history of malignant neoplasm of breast: Secondary | ICD-10-CM

## 2013-11-04 DIAGNOSIS — C50919 Malignant neoplasm of unspecified site of unspecified female breast: Secondary | ICD-10-CM

## 2013-11-04 MED ORDER — GADOBENATE DIMEGLUMINE 529 MG/ML IV SOLN
20.0000 mL | Freq: Once | INTRAVENOUS | Status: AC | PRN
  Administered 2013-11-04: 20 mL via INTRAVENOUS

## 2013-11-17 ENCOUNTER — Telehealth: Payer: Self-pay | Admitting: *Deleted

## 2013-11-17 ENCOUNTER — Other Ambulatory Visit: Payer: Self-pay | Admitting: *Deleted

## 2013-11-17 DIAGNOSIS — C50412 Malignant neoplasm of upper-outer quadrant of left female breast: Secondary | ICD-10-CM

## 2013-11-18 ENCOUNTER — Other Ambulatory Visit (HOSPITAL_BASED_OUTPATIENT_CLINIC_OR_DEPARTMENT_OTHER): Payer: BC Managed Care – PPO

## 2013-11-18 DIAGNOSIS — C50412 Malignant neoplasm of upper-outer quadrant of left female breast: Secondary | ICD-10-CM

## 2013-11-18 LAB — CBC WITH DIFFERENTIAL/PLATELET
BASO%: 1.2 % (ref 0.0–2.0)
Basophils Absolute: 0.1 10*3/uL (ref 0.0–0.1)
EOS%: 2.1 % (ref 0.0–7.0)
Eosinophils Absolute: 0.1 10*3/uL (ref 0.0–0.5)
HEMATOCRIT: 41 % (ref 34.8–46.6)
HEMOGLOBIN: 13.4 g/dL (ref 11.6–15.9)
LYMPH%: 36.6 % (ref 14.0–49.7)
MCH: 29.4 pg (ref 25.1–34.0)
MCHC: 32.8 g/dL (ref 31.5–36.0)
MCV: 89.7 fL (ref 79.5–101.0)
MONO#: 0.4 10*3/uL (ref 0.1–0.9)
MONO%: 5.5 % (ref 0.0–14.0)
NEUT#: 3.6 10*3/uL (ref 1.5–6.5)
NEUT%: 54.6 % (ref 38.4–76.8)
PLATELETS: 309 10*3/uL (ref 145–400)
RBC: 4.57 10*6/uL (ref 3.70–5.45)
RDW: 13.1 % (ref 11.2–14.5)
WBC: 6.5 10*3/uL (ref 3.9–10.3)
lymph#: 2.4 10*3/uL (ref 0.9–3.3)

## 2013-11-18 LAB — COMPREHENSIVE METABOLIC PANEL (CC13)
ALBUMIN: 3.8 g/dL (ref 3.5–5.0)
ALK PHOS: 58 U/L (ref 40–150)
ALT: 20 U/L (ref 0–55)
AST: 16 U/L (ref 5–34)
Anion Gap: 6 mEq/L (ref 3–11)
BUN: 9.2 mg/dL (ref 7.0–26.0)
CO2: 28 meq/L (ref 22–29)
Calcium: 9.2 mg/dL (ref 8.4–10.4)
Chloride: 106 mEq/L (ref 98–109)
Creatinine: 0.8 mg/dL (ref 0.6–1.1)
Glucose: 103 mg/dl (ref 70–140)
Potassium: 4.1 mEq/L (ref 3.5–5.1)
SODIUM: 140 meq/L (ref 136–145)
Total Bilirubin: 0.52 mg/dL (ref 0.20–1.20)
Total Protein: 6.9 g/dL (ref 6.4–8.3)

## 2013-11-25 ENCOUNTER — Ambulatory Visit (HOSPITAL_BASED_OUTPATIENT_CLINIC_OR_DEPARTMENT_OTHER): Payer: BC Managed Care – PPO | Admitting: Oncology

## 2013-11-25 ENCOUNTER — Telehealth: Payer: Self-pay | Admitting: Oncology

## 2013-11-25 VITALS — BP 93/47 | HR 91 | Temp 98.9°F | Resp 18 | Ht 66.0 in | Wt 228.2 lb

## 2013-11-25 DIAGNOSIS — Z1509 Genetic susceptibility to other malignant neoplasm: Secondary | ICD-10-CM

## 2013-11-25 DIAGNOSIS — C4359 Malignant melanoma of other part of trunk: Secondary | ICD-10-CM

## 2013-11-25 DIAGNOSIS — C73 Malignant neoplasm of thyroid gland: Secondary | ICD-10-CM

## 2013-11-25 DIAGNOSIS — Z23 Encounter for immunization: Secondary | ICD-10-CM

## 2013-11-25 DIAGNOSIS — Z1501 Genetic susceptibility to malignant neoplasm of breast: Secondary | ICD-10-CM

## 2013-11-25 DIAGNOSIS — C50412 Malignant neoplasm of upper-outer quadrant of left female breast: Secondary | ICD-10-CM

## 2013-11-25 MED ORDER — INFLUENZA VAC SPLIT QUAD 0.5 ML IM SUSY
0.5000 mL | PREFILLED_SYRINGE | Freq: Once | INTRAMUSCULAR | Status: AC
  Administered 2013-11-25: 0.5 mL via INTRAMUSCULAR
  Filled 2013-11-25: qty 0.5

## 2013-11-25 NOTE — Addendum Note (Signed)
Addended by: Laureen Abrahams on: 11/25/2013 05:01 PM   Modules accepted: Orders, Medications

## 2013-11-25 NOTE — Progress Notes (Signed)
ID: Audrey Chambers   DOB: July 30, 1975  MR#: [id number]  DSK#:[id number]  PCP: [name], MD GYN: [name] SU: OTHER MD: [name], [name]   HISTORY OF PRESENT ILLNESS: In mid October of 2010 Audrey Chambers palpated a mass in her left breast. She brought this to Dr. [name] attention and he set her up for bilateral mammography October of 2010.  Dr. [name] was able to palpate a mobile nodule in the left breast at the 12:00 position which by mammography showed up as subtle questionable asymmetry. By ultrasound there was an irregular hypoechoic mass measuring 1.7 cm. Biopsy was performed the same day and showed ([id number]) an invasive mammary carcinoma which was estrogen and progesterone receptor negative, with an MIB-1 of 78% and no HER-2 amplification, the ratio being 0.91.   With this information the patient was referred to Dr. [name] and bilateral breast MRIs were obtained November 03, 2008. This showed a solitary mass in the left breast measuring 2.2 cm with no evidence of internal mammary or axillary lymph node positivity. At this point the patient was set up for genetic studies. She proved to be BRCA-1 positive. Scans showed no evidence of metastatic disease otherwise a right thyroid nodule noted at that time.   With this information the patient proceeded to left partial mastectomy with axillary lymph node sampling November 2010. The final pathology (S. [id number]) showed a 2.1 cm invasive ductal carcinoma with negative and ample margins but with evidence of lymphovascular invasion. One out of 3 sentinel lymph nodes was involved by micrometastatic deposit. The tumor was grade 3   Her subsequent history is as detailed below  INTERVAL HISTORY: Audrey Chambers returns today for followup of her left breast cancer accompanied byher husband Audrey Chambers. The interval history is generally benign. She is not exercising regularly, however.  REVIEW OF SYSTEMS: Rahi denies insomnia, weight gain, or  significant problems with hot flashes. She uses gabapentin at night and that helps. No changes are not a problem. Vaginal dryness is an issue. She became concerned about her high heart rate with any activity, and was evaluated for this by Audrey Chambers 10/25/2013. Electrocardiogram was normal and Holter showed an average heart rate of 104, the range being 71-151. There were no arrhythmias. She had an echo with an ejection fraction of 60-65%. There is a stress test pending scheduled for January. Aside from these concerns a detailed review of systems today was benign.  PAST MEDICAL HISTORY: Past Medical History  Diagnosis Date  . Breast CA     breast ca dx 11/10  . Melanoma of back 2002    resolved  Significant for melanoma removal approximately 10 years ago from the upper back.  Dr. [name] did the original excision and Dr. [name] did the deeper surgery.  The patient tells me this was "superficial" and certainly being more than 10 years ago, this is almost certainly cured.  Polycystic ovaries. Status post exploratory laparotomy for what sounds like it may have been mild endometriosis.    PAST SURGICAL HISTORY: Past Surgical History  Procedure Laterality Date  . Exploratory laparotomy  remote    for possible endometriosis  . Thyroidectomy  January 2011    for papillary thyroid CA    FAMILY HISTORY Family History  Problem Relation Age of Onset  . Breast cancer Mother 21    survived  . Ovarian cancer Sister 76    died  The patient's father is alive at age 40.  Her mother had  breast cancer diagnosed in her sixties.  The patient's mother's only sister only sister had ovarian cancer diagnosed in her 59s and died from that disease.  The patient's mother is alive.   The patient has one brother and one sister, both in good health. The patient's sister has been tested for the BRCA mutation and was found to be positive. She has undergone bilateral salpingo-oophorectomy and according to the  patient is being appropriately screened.  GYNECOLOGIC HISTORY: The patient is Audrey Chambers she never took oral contraceptives; s/p BSO November 2011 with benign pathology  SOCIAL HISTORY: (updated NOV 2013) She works part time at [employer] in [place] [place].  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 Eleshia also works at [employer]. They have no children.  They have a dog and a cat at home.  The patient is not a church attender.     ADVANCED DIRECTIVES:  HEALTH MAINTENANCE: History  Substance Use Topics  . Smoking status: Never Smoker   . Smokeless tobacco: Never Used  . Alcohol Use: No     Colonoscopy:  PAP: UTD/Mezer  Bone density:  Lipid panel:  Allergies  Allergen Reactions  . Neomycin-Bacitracin Zn-Polymyx Rash    Current Outpatient Prescriptions  Medication Sig Dispense Refill  . Calcium Carbonate-Vitamin D (CALCIUM 600 + D PO) Take 1 tablet by mouth daily.      . cetirizine (ZYRTEC) 10 MG tablet Take 10 mg by mouth daily.      . fluticasone (FLONASE) 50 MCG/ACT nasal spray     . gabapentin (NEURONTIN) 100 MG capsule TAKE 1 CAPSULE BY MOUTH EVERY NIGHT AT BEDTIME 90 capsule 1  . metoprolol tartrate (LOPRESSOR) 25 MG tablet Take 0.5 tablets (12.5 mg total) by mouth 2 (two) times daily. 30 tablet 3  . SYNTHROID 175 MCG tablet Take 150 mcg by mouth daily before breakfast.      No current facility-administered medications for this visit.    OBJECTIVE: Young-appearing white woman who appears well Filed Vitals:   11/25/13 0900  BP: 93/47  Pulse: 91  Temp: 98.9 F (37.2 C)  Resp: 18     Body mass index is 36.85 kg/(m^2).    ECOG FS: 0 Filed Weights   11/25/13 0900  Weight: 228 lb 3.2 oz (103.511 kg)   Sclerae unicteric, pupils equal and reactive Oropharynx clear and moist-- no thrush No cervical or supraclavicular  adenopathy Lungs no rales or rhonchi Heart regular rate and rhythm Abd soft, nontender, positive bowel sounds MSK no focal spinal tenderness, no upper extremity lymphedema Neuro: nonfocal, well oriented, appropriate affect Breasts: The right breast is unremarkable. The left breast is status post lumpectomy and radiation. There is no evidence of local recurrence. The right axilla is benign.   LAB RESULTS: Lab Results  Component Value Date   WBC 6.5 11/18/2013   NEUTROABS 3.6 11/18/2013   HGB 13.4 11/18/2013   HCT 41.0 11/18/2013   MCV 89.7 11/18/2013   PLT 309 11/18/2013      Chemistry      Component Value Date/Time   NA 140 11/18/2013 1017   NA 141 05/20/2011 1241   NA 140 11/06/2010 1455   K 4.1 11/18/2013 1017   K 4.1 05/20/2011 1241   K 3.8 11/06/2010 1455   CL 103 05/25/2012 1457   CL 96* 05/20/2011 1241   CL 102 11/06/2010  1455   CO2 28 11/18/2013 1017   CO2 28 05/20/2011 1241   CO2 30 11/06/2010 1455   BUN 9.2 11/18/2013 1017   BUN 13 05/20/2011 1241   BUN 11 11/06/2010 1455   CREATININE 0.8 11/18/2013 1017   CREATININE 0.6 05/20/2011 1241   CREATININE 0.83 11/06/2010 1455      Component Value Date/Time   CALCIUM 9.2 11/18/2013 1017   CALCIUM 8.6 05/20/2011 1241   CALCIUM 9.8 11/06/2010 1455   ALKPHOS 58 11/18/2013 1017   ALKPHOS 83 05/20/2011 1241   ALKPHOS 99 11/06/2010 1455   AST 16 11/18/2013 1017   AST 18 05/20/2011 1241   AST 15 11/06/2010 1455   ALT 20 11/18/2013 1017   ALT 18 05/20/2011 1241   ALT 16 11/06/2010 1455   BILITOT 0.52 11/18/2013 1017   BILITOT 0.60 05/20/2011 1241   BILITOT 0.1* 11/06/2010 1455       STUDIES: Mr Breast Bilateral W Contrast  11/04/2013   CLINICAL DATA:  The patient has a history of left lumpectomy in 2010. She underwent radiation and chemotherapy. The patient is BRCA positive. The patient also has a history of thyroid cancer and melanoma.  LABS:  No labs were performed at the imaging center.  EXAM: BILATERAL  BREAST MRI WITH AND WITHOUT CONTRAST  TECHNIQUE: Multiplanar, multisequence MR images of both breasts were obtained prior to and following the intravenous administration of 12m of MultiHance.  THREE-DIMENSIONAL MR IMAGE RENDERING ON INDEPENDENT WORKSTATION:  Three-dimensional MR images were rendered by post-processing of the original MR data on an independent workstation. The three-dimensional MR images were interpreted, and findings are reported in the following complete MRI report for this study. Three dimensional images were evaluated at the independent DynaCad workstation  COMPARISON:  Multiple prior mammograms, the most recent dated 11/04/2013. Breast MRIs dated 11/03/2012, 11/01/2011, 10/29/2010, 10/27/2009  FINDINGS: Breast composition: b.  Scattered fibroglandular tissue.  Background parenchymal enhancement: Moderate  Right breast: No suspicious enhancement.  Left breast: Postlumpectomy and posttreatment changes are again seen of the left breast. No suspicious enhancement.  Lymph nodes: No abnormal appearing lymph nodes.  Ancillary findings:  None.  IMPRESSION: No evidence of malignancy.  RECOMMENDATION: Annual diagnostic mammography and high risk screening breast MRI.  BI-RADS CATEGORY  2: Benign.   Electronically Signed   By: EPamelia HoitM.D.   On: 11/04/2013 11:19   Mm Digital Diagnostic Bilat  11/04/2013   CLINICAL DATA:  Patient is a currently asymptomatic 38year old female with a personal history of left breast cancer status post conservation therapy in 2010 including lumpectomy, chemotherapy, and radiation.  EXAM: DIGITAL DIAGNOSTIC  BILATERAL MAMMOGRAM WITH CAD  COMPARISON:  10/16/2008 through 11/03/2012  ACR Breast Density Category b: There are scattered areas of fibroglandular density.  FINDINGS: Digital bilateral diagnostic mammography demonstrates scattered fibroglandular densities. Postsurgical scarring is again identified in the upper slightly outer left breast at site of lumpectomy.  Spot-compression magnification view of the lumpectomy bed reveals stable postsurgical scarring and effacement of normal fibroglandular tissue. No new mass or suspicious microcalcification. Minimal left breast skin thickening has decreased over time consistent with radiation sequelae. There has been no significant interval change.  Mammographic images were processed with CAD.  IMPRESSION: Post lumpectomy changes of the left breast with no mammographic findings of malignancy.  RECOMMENDATION: Diagnostic mammogram is suggested in 1 year. (Code:DM-B-01Y)  I have discussed the findings and recommendations with the patient. Results were also provided in writing at the conclusion of the visit. If  applicable, a reminder letter will be sent to the patient regarding the next appointment.  BI-RADS CATEGORY  2: Benign Finding(s)   Electronically Signed   By: Andres Shad   On: 11/04/2013 08:58     ASSESSMENT: 39 y.o.  BRCA-1 positive [place] woman    (1) status post post left lumpectomy and sentinel lymph node dissection November of 2010 for a T2 N1(mic), stage IB invasive ductal carcinoma, triple negative, with an MIB-1 of 78%, treated adjuvantly with carboplatin and paclitaxel x3 followed by dose dense doxorubicin and cyclophosphamide x4 followed by radiation completed August of 2011   (2) papillary thyroid carcinoma post total thyroidectomy in January of 2011 with 5 of 6 lymph nodes involved, status post radioiodine ablation, with post treatment hypothyroidism, followed through doctor Kerr's office   (3) status post removal of a nevoid melanoma, focally superficial level IV, Breslow depth 0.46 mm, from the mid to upper back, with margins initially involved 03/13/1998, status post excision of multiple dysplastic nevi since that time, followed by Dr. Tonia Brooms  (4) s/p BSO November 2011 with benign pathology  (5) BRCA1 carries a deleterious mutation ([id number]); there was no mutation in the BRCA2 gene (testing  11/04/2008)  PLAN: Swayzie is now 5 years out from her definitive surgery for her stage I breast cancer. This is very favorable. We are going to continue to see her on a once a year basis, and because of her BRCA1 mutation she will have her yearly breast MRI as well as yearly mammography.  She follows up with Dr. Tonia Brooms late winter as far as her melanoma is concerned, and with Dr. Buddy Duty late summer as far as her papillary thyroid carcinoma is concerned.  Today I gave her information on the [facility] program, which I think she would greatly benefit from. I also gave her information on our "intimacy and pelvic health" program. Finally I suggested she participate in our upcoming survivorship clinic and she was agreeable. She will be contacted as soon as that is her last.  She received her flu shot today.  Inge knows to call for any problems that may develop before her next visit here.  MAGRINAT,GUSTAV C    11/25/2013

## 2013-11-25 NOTE — Telephone Encounter (Signed)
per pof to sch pt appt-gave pt copy of sch-pt stated will call to make mamma & MRI @ the same time per pof

## 2013-11-29 ENCOUNTER — Telehealth (HOSPITAL_COMMUNITY): Payer: Self-pay | Admitting: Vascular Surgery

## 2013-11-29 NOTE — Telephone Encounter (Signed)
Will send to Dr McLean for review 

## 2013-11-29 NOTE — Telephone Encounter (Signed)
If she is still short of breath with exertion, she should do the CPX to see if there is any evidence for cardiac or pulmonary disease.  If she is not longer short of breath with exertion, she does not need CPX.

## 2013-11-29 NOTE — Telephone Encounter (Signed)
PT called she feels like she doesn't need to do the CPX because all her other test was normal... She will do it if Dr. Aundra Dubin feels like she should...  If she does she wants it this year she met her deductable.Marland Kitchen She would like the doctor to tell her IF she needs to take the CPX...  Please advise

## 2013-12-01 NOTE — Telephone Encounter (Signed)
Spoke w/pt, she states she feels the SOB has gotten better and she prefers to hold off on CPX at this time, appt cx'd she will call back if she feels the SOB returns or gets worse

## 2014-01-06 ENCOUNTER — Other Ambulatory Visit: Payer: Self-pay | Admitting: Oncology

## 2014-01-14 ENCOUNTER — Encounter (HOSPITAL_COMMUNITY): Payer: BC Managed Care – PPO

## 2014-01-27 ENCOUNTER — Other Ambulatory Visit: Payer: Self-pay | Admitting: Dermatology

## 2014-02-02 ENCOUNTER — Other Ambulatory Visit: Payer: Self-pay | Admitting: *Deleted

## 2014-02-02 DIAGNOSIS — Z1509 Genetic susceptibility to other malignant neoplasm: Principal | ICD-10-CM

## 2014-02-02 DIAGNOSIS — C73 Malignant neoplasm of thyroid gland: Secondary | ICD-10-CM

## 2014-02-02 DIAGNOSIS — C50412 Malignant neoplasm of upper-outer quadrant of left female breast: Secondary | ICD-10-CM

## 2014-02-02 DIAGNOSIS — Z1501 Genetic susceptibility to malignant neoplasm of breast: Secondary | ICD-10-CM

## 2014-02-02 MED ORDER — GABAPENTIN 100 MG PO CAPS: 100.0000 mg | ORAL_CAPSULE | Freq: Every day | ORAL | Status: DC

## 2014-02-25 NOTE — Telephone Encounter (Signed)
none

## 2014-03-16 ENCOUNTER — Other Ambulatory Visit: Payer: Self-pay | Admitting: Dermatology

## 2014-05-10 ENCOUNTER — Other Ambulatory Visit: Payer: Self-pay | Admitting: Gynecology

## 2014-05-11 LAB — CYTOLOGY - PAP

## 2014-10-12 ENCOUNTER — Other Ambulatory Visit: Payer: Self-pay | Admitting: Internal Medicine

## 2014-10-12 DIAGNOSIS — R1310 Dysphagia, unspecified: Secondary | ICD-10-CM

## 2014-10-12 DIAGNOSIS — Z85850 Personal history of malignant neoplasm of thyroid: Secondary | ICD-10-CM

## 2014-10-18 ENCOUNTER — Ambulatory Visit
Admission: RE | Admit: 2014-10-18 | Discharge: 2014-10-18 | Disposition: A | Discharge status: Home / Self Care | Payer: BLUE CROSS/BLUE SHIELD | Source: Ambulatory Visit | Attending: Internal Medicine | Admitting: Internal Medicine

## 2014-10-18 ENCOUNTER — Other Ambulatory Visit: Payer: Self-pay

## 2014-10-18 DIAGNOSIS — Z85850 Personal history of malignant neoplasm of thyroid: Secondary | ICD-10-CM

## 2014-10-18 DIAGNOSIS — R1310 Dysphagia, unspecified: Secondary | ICD-10-CM

## 2014-11-11 ENCOUNTER — Other Ambulatory Visit: Payer: Self-pay | Admitting: Oncology

## 2014-11-14 ENCOUNTER — Ambulatory Visit
Admission: RE | Admit: 2014-11-14 | Discharge: 2014-11-14 | Disposition: A | Discharge status: Home / Self Care | Payer: BLUE CROSS/BLUE SHIELD | Source: Ambulatory Visit | Attending: Oncology | Admitting: Oncology

## 2014-11-14 DIAGNOSIS — C73 Malignant neoplasm of thyroid gland: Secondary | ICD-10-CM

## 2014-11-14 DIAGNOSIS — C50412 Malignant neoplasm of upper-outer quadrant of left female breast: Secondary | ICD-10-CM

## 2014-11-14 DIAGNOSIS — C4359 Malignant melanoma of other part of trunk: Secondary | ICD-10-CM

## 2014-11-14 DIAGNOSIS — Z1509 Genetic susceptibility to other malignant neoplasm: Secondary | ICD-10-CM

## 2014-11-14 DIAGNOSIS — Z1501 Genetic susceptibility to malignant neoplasm of breast: Secondary | ICD-10-CM

## 2014-11-14 MED ORDER — GADOBENATE DIMEGLUMINE 529 MG/ML IV SOLN
20.0000 mL | Freq: Once | INTRAVENOUS | Status: AC | PRN
  Administered 2014-11-14: 20 mL via INTRAVENOUS

## 2014-11-17 ENCOUNTER — Telehealth: Payer: Self-pay

## 2014-11-17 ENCOUNTER — Other Ambulatory Visit: Payer: Self-pay | Admitting: Oncology

## 2014-11-17 NOTE — Telephone Encounter (Signed)
Called patient with MRI results of the breasts.  Per Dr. Jana Hakim MRI is clear.  Patient has f/u with labs in two weeks.11/28/14.

## 2014-11-21 ENCOUNTER — Other Ambulatory Visit (HOSPITAL_BASED_OUTPATIENT_CLINIC_OR_DEPARTMENT_OTHER): Payer: BLUE CROSS/BLUE SHIELD

## 2014-11-21 DIAGNOSIS — C4359 Malignant melanoma of other part of trunk: Secondary | ICD-10-CM

## 2014-11-21 DIAGNOSIS — C73 Malignant neoplasm of thyroid gland: Secondary | ICD-10-CM | POA: Diagnosis not present

## 2014-11-21 DIAGNOSIS — Z1501 Genetic susceptibility to malignant neoplasm of breast: Secondary | ICD-10-CM

## 2014-11-21 DIAGNOSIS — C50412 Malignant neoplasm of upper-outer quadrant of left female breast: Secondary | ICD-10-CM | POA: Diagnosis not present

## 2014-11-21 DIAGNOSIS — Z1509 Genetic susceptibility to other malignant neoplasm: Secondary | ICD-10-CM

## 2014-11-21 LAB — CBC WITH DIFFERENTIAL/PLATELET
BASO%: 0.5 % (ref 0.0–2.0)
Basophils Absolute: 0 10*3/uL (ref 0.0–0.1)
EOS ABS: 0.2 10*3/uL (ref 0.0–0.5)
EOS%: 2.8 % (ref 0.0–7.0)
HCT: 40.9 % (ref 34.8–46.6)
HEMOGLOBIN: 13.9 g/dL (ref 11.6–15.9)
LYMPH%: 33.9 % (ref 14.0–49.7)
MCH: 30.4 pg (ref 25.1–34.0)
MCHC: 34 g/dL (ref 31.5–36.0)
MCV: 89.5 fL (ref 79.5–101.0)
MONO#: 0.4 10*3/uL (ref 0.1–0.9)
MONO%: 7.3 % (ref 0.0–14.0)
NEUT%: 55.5 % (ref 38.4–76.8)
NEUTROS ABS: 3.2 10*3/uL (ref 1.5–6.5)
Platelets: 289 10*3/uL (ref 145–400)
RBC: 4.57 10*6/uL (ref 3.70–5.45)
RDW: 13 % (ref 11.2–14.5)
WBC: 5.8 10*3/uL (ref 3.9–10.3)
lymph#: 2 10*3/uL (ref 0.9–3.3)

## 2014-11-21 LAB — COMPREHENSIVE METABOLIC PANEL (CC13)
ALBUMIN: 3.7 g/dL (ref 3.5–5.0)
ALK PHOS: 63 U/L (ref 40–150)
ALT: 17 U/L (ref 0–55)
AST: 15 U/L (ref 5–34)
Anion Gap: 8 mEq/L (ref 3–11)
BILIRUBIN TOTAL: 0.59 mg/dL (ref 0.20–1.20)
BUN: 11.6 mg/dL (ref 7.0–26.0)
CO2: 27 meq/L (ref 22–29)
CREATININE: 0.8 mg/dL (ref 0.6–1.1)
Calcium: 9.4 mg/dL (ref 8.4–10.4)
Chloride: 105 mEq/L (ref 98–109)
EGFR: >90 mL/min/{1.73_m2} (ref >90)
GLUCOSE: 93 mg/dL (ref 70–140)
Potassium: 4.4 mEq/L (ref 3.5–5.1)
SODIUM: 140 meq/L (ref 136–145)
TOTAL PROTEIN: 6.9 g/dL (ref 6.4–8.3)

## 2014-11-28 ENCOUNTER — Telehealth: Payer: Self-pay | Admitting: Oncology

## 2014-11-28 ENCOUNTER — Ambulatory Visit (HOSPITAL_BASED_OUTPATIENT_CLINIC_OR_DEPARTMENT_OTHER): Payer: BLUE CROSS/BLUE SHIELD | Admitting: Oncology

## 2014-11-28 VITALS — BP 119/82 | HR 104 | Temp 98.5°F | Resp 18 | Ht 66.0 in | Wt 227.6 lb

## 2014-11-28 DIAGNOSIS — C50412 Malignant neoplasm of upper-outer quadrant of left female breast: Secondary | ICD-10-CM | POA: Diagnosis not present

## 2014-11-28 DIAGNOSIS — C73 Malignant neoplasm of thyroid gland: Secondary | ICD-10-CM | POA: Diagnosis not present

## 2014-11-28 DIAGNOSIS — Z1501 Genetic susceptibility to malignant neoplasm of breast: Secondary | ICD-10-CM

## 2014-11-28 DIAGNOSIS — Z1509 Genetic susceptibility to other malignant neoplasm: Secondary | ICD-10-CM

## 2014-11-28 MED ORDER — GABAPENTIN 100 MG PO CAPS: 100.0000 mg | ORAL_CAPSULE | Freq: Every day | ORAL | Status: DC

## 2014-11-28 NOTE — Progress Notes (Signed)
ID: Audrey Chambers   DOB: 07/31/1975  MR#: [id number]  ATF#:[id number]  PCP: [name], MD GYN: [name] SU: OTHER MD: [name], [name]   HISTORY OF PRESENT ILLNESS: In mid October of 2010 Audrey Chambers palpated a mass in her left breast. She brought this to Dr. [name] attention and he set her up for bilateral mammography October of 2010.  Dr. [name] was able to palpate a mobile nodule in the left breast at the 12:00 position which by mammography showed up as subtle questionable asymmetry. By ultrasound there was an irregular hypoechoic mass measuring 1.7 cm. Biopsy was performed the same day and showed ([id number]) an invasive mammary carcinoma which was estrogen and progesterone receptor negative, with an MIB-1 of 78% and no HER-2 amplification, the ratio being 0.91.   With this information the patient was referred to Dr. [name] and bilateral breast MRIs were obtained November 03, 2008. This showed a solitary mass in the left breast measuring 2.2 cm with no evidence of internal mammary or axillary lymph node positivity. At this point the patient was set up for genetic studies. She proved to be BRCA-1 positive. Scans showed no evidence of metastatic disease otherwise a right thyroid nodule noted at that time.   With this information the patient proceeded to left partial mastectomy with axillary lymph node sampling November 2010. The final pathology (S. [id number]) showed a 2.1 cm invasive ductal carcinoma with negative and ample margins but with evidence of lymphovascular invasion. One out of 3 sentinel lymph nodes was involved by micrometastatic deposit. The tumor was grade 3   Her subsequent history is as detailed below  INTERVAL HISTORY: Audrey Chambers returns today for followup of her triple negative breast cancer. Audrey Chambers her husband was not able to come because he just had all 4 wisdom teeth pulled.   REVIEW OF SYSTEMS: Audrey Chambers rarely has difficulty swallowing, when dry food gets  stuck on her throat. She has been advised to chew her food properly, eat moist foods, and drink plenty of liquids. The nighttime hot flashes are well-controlled on a small dose of gabapentin at bedtime. Audrey Chambers is not exercising regularly. Aside from that a detailed review of systems today is entirely negative.  PAST MEDICAL HISTORY: Past Medical History  Diagnosis Date  . Breast CA     breast ca dx 11/10  . Melanoma of back 2002    resolved  Significant for melanoma removal approximately 10 years ago from the upper back.  Dr. [name] did the original excision and Dr. [name] did the deeper surgery.  The patient tells me this was "superficial" and certainly being more than 10 years ago, this is almost certainly cured.  Polycystic ovaries. Status post exploratory laparotomy for what sounds like it may have been mild endometriosis.    PAST SURGICAL HISTORY: Past Surgical History  Procedure Laterality Date  . Exploratory laparotomy  remote    for possible endometriosis  . Thyroidectomy  January 2011    for papillary thyroid CA    FAMILY HISTORY Family History  Problem Relation Age of Onset  . Breast cancer Mother 71    survived  . Ovarian cancer Sister 61    died  The patient's father is alive at age 41.  Her mother had breast cancer diagnosed in her 46.  The patient's mother's only sister only sister had ovarian cancer diagnosed in her 58s and died from that disease.  The patient's mother is alive.   The patient has  one brother and one sister, both in good health. The patient's sister has been tested for the BRCA mutation and was found to be positive. She has undergone bilateral salpingo-oophorectomy and according to the patient is being appropriately screened.  GYNECOLOGIC HISTORY: The patient is Audrey Chambers P0 she never took oral contraceptives; s/p BSO November 2011 with benign pathology  SOCIAL HISTORY: (updated NOV 2013) She works part time at [employer] in [employer] [place].  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 Audrey Chambers also works at [employer]. They have no children.  They have a dog and a cat at home.  The patient is not a church attender.     ADVANCED DIRECTIVES: Not in place  HEALTH MAINTENANCE: Social History  Substance Use Topics  . Smoking status: Never Smoker   . Smokeless tobacco: Never Used  . Alcohol Use: No     Colonoscopy:  PAP: UTD/Mezer  Bone density:  Lipid panel:  Allergies  Allergen Reactions  . Neomycin-Bacitracin Zn-Polymyx Rash    Current Outpatient Prescriptions  Medication Sig Dispense Refill  . Calcium Carbonate-Vitamin D (CALCIUM 600 + D PO) Take 1 tablet by mouth daily.      . cetirizine (ZYRTEC) 10 MG tablet Take 10 mg by mouth daily.      . fluticasone (FLONASE) 50 MCG/ACT nasal spray     . gabapentin (NEURONTIN) 100 MG capsule Take 1 capsule (100 mg total) by mouth at bedtime. 90 capsule 3  . SYNTHROID 150 MCG tablet   11   No current facility-administered medications for this visit.    OBJECTIVE: Young-appearing white woman in no acute distress Filed Vitals:   11/28/14 1344  BP: 119/82  Pulse: 104  Temp: 98.5 F (36.9 C)  Resp: 18     Body mass index is 36.75 kg/(m^2).    ECOG FS: 0 Filed Weights   11/28/14 1344  Weight: 227 lb 9.6 oz (103.239 kg)   Sclerae unicteric, EOMs intact Oropharynx clear, dentition in good repair No cervical or supraclavicular adenopathy Lungs no rales or rhonchi Heart regular rate and rhythm Abd soft, nontender, positive bowel sounds MSK no focal spinal tenderness, no upper extremity lymphedema Neuro: nonfocal, well oriented, appropriate affect Breasts: The right breast is unremarkable. The left breast is status post lumpectomy and radiation. There is no evidence of local recurrence. The left axilla is benign.   LAB RESULTS: Lab  Results  Component Value Date   WBC 5.8 11/21/2014   NEUTROABS 3.2 11/21/2014   HGB 13.9 11/21/2014   HCT 40.9 11/21/2014   MCV 89.5 11/21/2014   PLT 289 11/21/2014      Chemistry      Component Value Date/Time   NA 140 11/21/2014 0835   NA 141 05/20/2011 1241   NA 140 11/06/2010 1455   K 4.4 11/21/2014 0835   K 4.1 05/20/2011 1241   K 3.8 11/06/2010 1455   CL 103 05/25/2012 1457   CL 96* 05/20/2011 1241   CL 102 11/06/2010 1455   CO2 27 11/21/2014 0835   CO2 28 05/20/2011 1241   CO2 30 11/06/2010 1455   BUN 11.6 11/21/2014 0835   BUN 13 05/20/2011 1241   BUN 11 11/06/2010 1455   CREATININE 0.8 11/21/2014 0835   CREATININE 0.6 05/20/2011 1241   CREATININE 0.83 11/06/2010 1455      Component Value Date/Time  CALCIUM 9.4 11/21/2014 0835   CALCIUM 8.6 05/20/2011 1241   CALCIUM 9.8 11/06/2010 1455   ALKPHOS 63 11/21/2014 0835   ALKPHOS 83 05/20/2011 1241   ALKPHOS 99 11/06/2010 1455   AST 15 11/21/2014 0835   AST 18 05/20/2011 1241   AST 15 11/06/2010 1455   ALT 17 11/21/2014 0835   ALT 18 05/20/2011 1241   ALT 16 11/06/2010 1455   BILITOT 0.59 11/21/2014 0835   BILITOT 0.60 05/20/2011 1241   BILITOT 0.1* 11/06/2010 1455       STUDIES: Mr Breast Bilateral W Wo Contrast  11/14/2014  CLINICAL DATA:  39 year old female status post left lumpectomy in 2010 followed by chemotherapy and radiation. The patient is BRCA positive, and also has personal history of thyroid cancer and melanoma. LABS:  No recent labs. EXAM: BILATERAL BREAST MRI WITH AND WITHOUT CONTRAST TECHNIQUE: Multiplanar, multisequence MR images of both breasts were obtained prior to and following the intravenous administration of 20 ml of MultiHance. THREE-DIMENSIONAL MR IMAGE RENDERING ON INDEPENDENT WORKSTATION: Three-dimensional MR images were rendered by post-processing of the original MR data on an independent workstation. The three-dimensional MR images were interpreted, and findings are reported in  the following complete MRI report for this study. Three dimensional images were evaluated at the independent Dyna[employer] workstation COMPARISON:  Previous exam(s). FINDINGS: Breast composition: b. Scattered fibroglandular tissue. Background parenchymal enhancement: Mild. Right breast: No mass or abnormal enhancement. Left breast: Postsurgical changes again noted in the left breast status post lumpectomy. No mass or abnormal enhancement. Lymph nodes: No abnormal appearing lymph nodes. Ancillary findings:  None. IMPRESSION: No MRI evidence of malignancy. RECOMMENDATION: Routine postlumpectomy bilateral diagnostic mammogram as well as bilateral breast MRI is recommended in 1 year. BI-RADS CATEGORY  BI-RADS 2.  Benign Electronically Signed   By: Ammie Ferrier M.D.   On: 11/14/2014 12:38   Mm Diag Breast Tomo Bilateral  11/14/2014  CLINICAL DATA:  39 year old female for annual bilateral mammograms. History of left breast cancer, chemotherapy, radiation and lumpectomy in 2010-2011. EXAM: DIGITAL DIAGNOSTIC BILATERAL MAMMOGRAM WITH 3D TOMOSYNTHESIS AND CAD COMPARISON:  11/04/2013 and prior mammograms dating back to 10/29/2010 ACR Breast Density Category b: There are scattered areas of fibroglandular density. FINDINGS: 2D and 3D full field views of both breasts and a spot magnification view of the left lumpectomy site demonstrates no suspicious mass, nonsurgical distortion or worrisome calcifications. Scarring within the upper outer left breast again identified. Mammographic images were processed with CAD. IMPRESSION: No mammographic evidence of breast malignancy. Left breast scarring P RECOMMENDATION: Bilateral diagnostic mammograms in 1 year. I have discussed the findings and recommendations with the patient. Results were also provided in writing at the conclusion of the visit. If applicable, a reminder letter will be sent to the patient regarding the next appointment. BI-RADS CATEGORY  2: Benign. Electronically Signed    By: Margarette Canada M.D.   On: 11/14/2014 12:35   CLINICAL DATA: Difficulty swallowing and history of prior thyroidectomy in 2011 for papillary carcinoma.  EXAM: ULTRASOUND OF HEAD/NECK SOFT TISSUES  TECHNIQUE: Ultrasound examination of the head and neck soft tissues was performed in the area of clinical concern.  COMPARISON: 10/08/2012  FINDINGS: Ovoid area of tissue in the left surgical thyroid bed appears unchanged and measures approximately 1.2 x 0.3 x 0.5 cm. This is stable since the prior study as well as additional previous ultrasound studies post thyroidectomy. No abnormal tissue is identified in the neck. A nonenlarged right cervical lymph node shows short axis  of 0.6 cm.  IMPRESSION: Stable small residual ovoid tissue in the left thyroid bed which has been unchanged by ultrasound over previous studies.   Electronically Signed  By: Aletta Edouard M.D.  On: 10/18/2014 16:57   ASSESSMENT: 39 y.o.  BRCA-1 positive [place] woman    (1) status post post left lumpectomy and sentinel lymph node dissection November of 2010 for a T2 N1(mic), stage IB invasive ductal carcinoma, triple negative, with an MIB-1 of 78%, treated adjuvantly with carboplatin and paclitaxel x3 followed by dose dense doxorubicin and cyclophosphamide x4 followed by radiation completed August of 2011   (2) papillary thyroid carcinoma post total thyroidectomy in January of 2011 with 5 of 6 lymph nodes involved, status post radioiodine ablation, with post treatment hypothyroidism, followed through doctor Kerr's office   (3) status post removal of a nevoid melanoma, focally superficial level IV, Breslow depth 0.46 mm, from the mid to upper back, with margins initially involved 03/13/1998, status post excision of multiple dysplastic nevi since that time, followed by Dr. Tonia Brooms  (4) s/p BSO November 2011 with benign pathology  (5) BRCA1 carries a deleterious mutation ([id number]); there was no  mutation in the BRCA2 gene (testing 11/04/2008)  PLAN: Akaylah continues to do well now 6 years out from her definitive surgery for her triple negative breast cancer. We are continuing yearly follow-up given her history of BRCA positivity.  She had to pay $1200 out of pocket for her breast MRI. We discussed doing breast MRIs every other year since her breast density is now category B. However she is very concerned there were going to miss something in her initial mammograms were negative even after she had a palpable mass. The plan accordingly will be to continue yearly MRIs at least through age 63.  I have encouraged her to exercise on a regular basis. She will consider getting a "fit bit" as a holiday gift  She will call with any problems that may develop before the next visit here  [place] C    11/28/2014

## 2014-11-28 NOTE — Telephone Encounter (Signed)
Appointments made and avs printed for patient °

## 2014-12-05 ENCOUNTER — Telehealth: Payer: Self-pay | Admitting: Nurse Practitioner

## 2014-12-05 NOTE — Telephone Encounter (Signed)
Spoke with patient re LTS visit for 12/14.

## 2014-12-15 ENCOUNTER — Telehealth: Payer: Self-pay | Admitting: Nurse Practitioner

## 2014-12-15 NOTE — Telephone Encounter (Signed)
Called and spoke with patient.  Appointment made for her in Survivorship for LTS visit for 12/2014.  She saw Dr. Jana Hakim for her follow up in 11/2014.  Advised that appointment to see me in Survivorship in 12/2014 was not needed at this time unless she had new complaints (which she denied).  Per Dr. Virgie Dad note, Ms. Accomando will be seen once a year and next appointment already scheduled for 11/2015.  Discussed the role of the Survivorship clinic and offered that patient may be offered to come to see [name] for upcoming exams in the future.  Pt without questions at this time and will keep her 11/2015 appointment as scheduled, with 12/2014 visit canceled.

## 2014-12-21 ENCOUNTER — Encounter: Payer: BLUE CROSS/BLUE SHIELD | Admitting: Nurse Practitioner

## 2015-02-09 ENCOUNTER — Other Ambulatory Visit: Payer: Self-pay | Admitting: Nurse Practitioner

## 2015-02-10 ENCOUNTER — Other Ambulatory Visit: Payer: Self-pay | Admitting: *Deleted

## 2015-02-10 DIAGNOSIS — C50412 Malignant neoplasm of upper-outer quadrant of left female breast: Secondary | ICD-10-CM

## 2015-02-10 DIAGNOSIS — C73 Malignant neoplasm of thyroid gland: Secondary | ICD-10-CM

## 2015-02-10 DIAGNOSIS — Z1509 Genetic susceptibility to other malignant neoplasm: Principal | ICD-10-CM

## 2015-02-10 DIAGNOSIS — Z1501 Genetic susceptibility to malignant neoplasm of breast: Secondary | ICD-10-CM

## 2015-02-10 MED ORDER — GABAPENTIN 100 MG PO CAPS: 100.0000 mg | ORAL_CAPSULE | Freq: Every day | ORAL | Status: DC

## 2015-05-16 DIAGNOSIS — C50912 Malignant neoplasm of unspecified site of left female breast: Secondary | ICD-10-CM | POA: Diagnosis not present

## 2015-05-24 DIAGNOSIS — Z01419 Encounter for gynecological examination (general) (routine) without abnormal findings: Secondary | ICD-10-CM | POA: Diagnosis not present

## 2015-05-24 DIAGNOSIS — Z6836 Body mass index (BMI) 36.0-36.9, adult: Secondary | ICD-10-CM | POA: Diagnosis not present

## 2015-06-16 DIAGNOSIS — C50912 Malignant neoplasm of unspecified site of left female breast: Secondary | ICD-10-CM | POA: Diagnosis not present

## 2015-10-12 ENCOUNTER — Other Ambulatory Visit: Payer: Self-pay | Admitting: Oncology

## 2015-10-12 DIAGNOSIS — Z853 Personal history of malignant neoplasm of breast: Secondary | ICD-10-CM

## 2015-10-14 ENCOUNTER — Other Ambulatory Visit: Payer: Self-pay | Admitting: Oncology

## 2015-10-14 DIAGNOSIS — C50412 Malignant neoplasm of upper-outer quadrant of left female breast: Secondary | ICD-10-CM

## 2015-10-14 DIAGNOSIS — C4359 Malignant melanoma of other part of trunk: Secondary | ICD-10-CM

## 2015-10-16 DIAGNOSIS — Z85850 Personal history of malignant neoplasm of thyroid: Secondary | ICD-10-CM | POA: Diagnosis not present

## 2015-10-16 DIAGNOSIS — E890 Postprocedural hypothyroidism: Secondary | ICD-10-CM | POA: Diagnosis not present

## 2015-10-19 DIAGNOSIS — Z85850 Personal history of malignant neoplasm of thyroid: Secondary | ICD-10-CM | POA: Diagnosis not present

## 2015-10-19 DIAGNOSIS — E890 Postprocedural hypothyroidism: Secondary | ICD-10-CM | POA: Diagnosis not present

## 2015-10-20 DIAGNOSIS — H01002 Unspecified blepharitis right lower eyelid: Secondary | ICD-10-CM | POA: Diagnosis not present

## 2015-10-20 DIAGNOSIS — H01005 Unspecified blepharitis left lower eyelid: Secondary | ICD-10-CM | POA: Diagnosis not present

## 2015-11-15 ENCOUNTER — Other Ambulatory Visit: Payer: Self-pay | Admitting: *Deleted

## 2015-11-16 ENCOUNTER — Ambulatory Visit
Admission: RE | Admit: 2015-11-16 | Discharge: 2015-11-16 | Disposition: A | Discharge status: Home / Self Care | Payer: BLUE CROSS/BLUE SHIELD | Source: Ambulatory Visit | Attending: Oncology | Admitting: Oncology

## 2015-11-16 DIAGNOSIS — R928 Other abnormal and inconclusive findings on diagnostic imaging of breast: Secondary | ICD-10-CM | POA: Diagnosis not present

## 2015-11-16 DIAGNOSIS — Z853 Personal history of malignant neoplasm of breast: Secondary | ICD-10-CM

## 2015-11-27 ENCOUNTER — Other Ambulatory Visit (HOSPITAL_BASED_OUTPATIENT_CLINIC_OR_DEPARTMENT_OTHER): Payer: BLUE CROSS/BLUE SHIELD

## 2015-11-27 DIAGNOSIS — C50412 Malignant neoplasm of upper-outer quadrant of left female breast: Secondary | ICD-10-CM

## 2015-11-27 LAB — CBC WITH DIFFERENTIAL/PLATELET
BASO%: 0.9 % (ref 0.0–2.0)
BASOS ABS: 0.1 10*3/uL (ref 0.0–0.1)
EOS ABS: 0.2 10*3/uL (ref 0.0–0.5)
EOS%: 3 % (ref 0.0–7.0)
HEMATOCRIT: 41.5 % (ref 34.8–46.6)
HGB: 13.9 g/dL (ref 11.6–15.9)
LYMPH%: 31.4 % (ref 14.0–49.7)
MCH: 30 pg (ref 25.1–34.0)
MCHC: 33.5 g/dL (ref 31.5–36.0)
MCV: 89.6 fL (ref 79.5–101.0)
MONO#: 0.4 10*3/uL (ref 0.1–0.9)
MONO%: 6.6 % (ref 0.0–14.0)
NEUT#: 3.4 10*3/uL (ref 1.5–6.5)
NEUT%: 58.1 % (ref 38.4–76.8)
PLATELETS: 294 10*3/uL (ref 145–400)
RBC: 4.63 10*6/uL (ref 3.70–5.45)
RDW: 13.1 % (ref 11.2–14.5)
WBC: 5.9 10*3/uL (ref 3.9–10.3)
lymph#: 1.8 10*3/uL (ref 0.9–3.3)

## 2015-11-27 LAB — COMPREHENSIVE METABOLIC PANEL
ALT: 15 U/L (ref 0–55)
ANION GAP: 8 meq/L (ref 3–11)
AST: 14 U/L (ref 5–34)
Albumin: 3.7 g/dL (ref 3.5–5.0)
Alkaline Phosphatase: 69 U/L (ref 40–150)
BUN: 16.2 mg/dL (ref 7.0–26.0)
CHLORIDE: 105 meq/L (ref 98–109)
CO2: 26 meq/L (ref 22–29)
Calcium: 9.3 mg/dL (ref 8.4–10.4)
Creatinine: 0.8 mg/dL (ref 0.6–1.1)
EGFR: 89 mL/min/{1.73_m2} — AB (ref >90)
Glucose: 95 mg/dl (ref 70–140)
POTASSIUM: 4.3 meq/L (ref 3.5–5.1)
Sodium: 139 mEq/L (ref 136–145)
Total Bilirubin: 0.5 mg/dL (ref 0.20–1.20)
Total Protein: 7.2 g/dL (ref 6.4–8.3)

## 2015-11-29 ENCOUNTER — Ambulatory Visit
Admission: RE | Admit: 2015-11-29 | Discharge: 2015-11-29 | Disposition: A | Discharge status: Home / Self Care | Payer: BLUE CROSS/BLUE SHIELD | Source: Ambulatory Visit | Attending: Oncology | Admitting: Oncology

## 2015-11-29 DIAGNOSIS — C50412 Malignant neoplasm of upper-outer quadrant of left female breast: Secondary | ICD-10-CM

## 2015-11-29 DIAGNOSIS — C4359 Malignant melanoma of other part of trunk: Secondary | ICD-10-CM

## 2015-11-29 DIAGNOSIS — N6489 Other specified disorders of breast: Secondary | ICD-10-CM | POA: Diagnosis not present

## 2015-11-29 MED ORDER — GADOBENATE DIMEGLUMINE 529 MG/ML IV SOLN
20.0000 mL | Freq: Once | INTRAVENOUS | Status: AC | PRN
  Administered 2015-11-29: 20 mL via INTRAVENOUS

## 2015-12-04 ENCOUNTER — Telehealth: Payer: Self-pay | Admitting: Oncology

## 2015-12-04 ENCOUNTER — Ambulatory Visit (HOSPITAL_BASED_OUTPATIENT_CLINIC_OR_DEPARTMENT_OTHER): Payer: BLUE CROSS/BLUE SHIELD | Admitting: Oncology

## 2015-12-04 VITALS — BP 124/67 | HR 102 | Temp 98.1°F | Resp 18 | Ht 66.0 in | Wt 228.5 lb

## 2015-12-04 DIAGNOSIS — Z171 Estrogen receptor negative status [ER-]: Principal | ICD-10-CM

## 2015-12-04 DIAGNOSIS — Z853 Personal history of malignant neoplasm of breast: Secondary | ICD-10-CM

## 2015-12-04 DIAGNOSIS — Z1509 Genetic susceptibility to other malignant neoplasm: Secondary | ICD-10-CM

## 2015-12-04 DIAGNOSIS — Z1501 Genetic susceptibility to malignant neoplasm of breast: Secondary | ICD-10-CM

## 2015-12-04 DIAGNOSIS — C50912 Malignant neoplasm of unspecified site of left female breast: Secondary | ICD-10-CM

## 2015-12-04 DIAGNOSIS — C50412 Malignant neoplasm of upper-outer quadrant of left female breast: Secondary | ICD-10-CM

## 2015-12-04 NOTE — Telephone Encounter (Signed)
Gave patient avs report and appointments for May and November. Message to GM to enter order for mammo.

## 2015-12-04 NOTE — Progress Notes (Signed)
ID: Audrey Chambers   DOB: Feb 18, 1975  MR#: [id number]  GYF#:[id number]  PCP: Audrey Smolder, MD GYN: Audrey Chambers   HISTORY OF PRESENT ILLNESS: From the original intake note:  In mid October of 2010 Audrey Chambers palpated a mass in her left breast. She brought this to Dr. Roque Chambers attention and he set her up for bilateral mammography October of 2010.  Dr. Glennon Chambers was able to palpate a mobile nodule in the left breast at the 12:00 position which by mammography showed up as subtle questionable asymmetry. By ultrasound there was an irregular hypoechoic mass measuring 1.7 cm. Biopsy was performed the same day and showed ([id number]) an invasive mammary carcinoma which was estrogen and progesterone receptor negative, with an MIB-1 of 78% and no HER-2 amplification, the ratio being 0.91.   With this information the patient was referred to Dr. Dalbert Chambers and bilateral breast MRIs were obtained November 03, 2008. This showed a solitary mass in the left breast measuring 2.2 cm with no evidence of internal mammary or axillary lymph node positivity. At this point the patient was set up for genetic studies. She proved to be BRCA-1 positive. Scans showed no evidence of metastatic disease otherwise a right thyroid nodule noted at that time.   With this information the patient proceeded to left partial mastectomy with axillary lymph node sampling November 2010. The final pathology (S. [id number]) showed a 2.1 cm invasive ductal carcinoma with negative and ample margins but with evidence of lymphovascular invasion. One out of 3 sentinel lymph nodes was involved by micrometastatic deposit. The tumor was grade 3   Her subsequent history is as detailed below  INTERVAL HISTORY: Audrey Chambers returns today for followup of her BRCA1-associated triple negative breast cancer accompanied by her husband Audrey Chambers. Audrey Chambers herself is doing "fine" and she just had her breast MRI which shows no  evidence of recurrence or active breast cancer  REVIEW OF SYSTEMS: Nevertheless this has been a hard year for them. Audrey Chambers's father was murdered within the last 2 months  In the course of what seems to have been house spray can. Audrey Chambers mother is becoming progressively demented and this is a major issue for them finally they just lost her dog after 11 years. Given all this is little surprised that Audrey Chambers has some insomnia and states up at night with anxious thoughts --aside from that however a detailed review of systems today was remarkably benign  PAST MEDICAL HISTORY: Past Medical History:  Diagnosis Date  . Breast CA    breast ca dx 11/10  . Melanoma of back 2002   resolved  Significant for melanoma removal approximately 10 years ago from the upper back.  Dr. Crista Chambers did the original excision and Dr. Marylene Chambers did the deeper surgery.  The patient tells me this was "superficial" and certainly being more than 10 years ago, this is almost certainly cured.  Polycystic ovaries. Status post exploratory laparotomy for what sounds like it may have been mild endometriosis.    PAST SURGICAL HISTORY: Past Surgical History:  Procedure Laterality Date  . EXPLORATORY LAPAROTOMY  remote   for possible endometriosis  . THYROIDECTOMY  January 2011   for papillary thyroid CA    FAMILY HISTORY Family History  Problem Relation Age of Onset  . Breast cancer Mother 43    survived  . Ovarian cancer Sister 33    died  The patient's father is alive at age 32.  Her mother had  breast cancer diagnosed in her sixties.  The patient's mother's only sister only sister had ovarian cancer diagnosed in her 41s and died from that disease.  The patient's mother is alive.   The patient has one brother and one sister, both in good health. The patient's sister has been tested for the BRCA mutation and was found to be positive. She has undergone bilateral salpingo-oophorectomy and according to the patient is being  appropriately screened.  GYNECOLOGIC HISTORY: The patient is Audrey Chambers P0 she never took oral contraceptives; s/p BSO November 2011 with benign pathology  SOCIAL HISTORY: (updated NOV 2013) She works part time at [employer] in [employer], [employer].  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 Audrey Chambers also works at [employer]. They have no children.  They have a dog and a cat at home.  The patient is not a church attender.     ADVANCED DIRECTIVES: Not in place  HEALTH MAINTENANCE: Social History  Substance Use Topics  . Smoking status: Never Smoker  . Smokeless tobacco: Never Used  . Alcohol use No     Colonoscopy:  PAP: UTD/Chambers  Bone density:  Lipid panel:  Allergies  Allergen Reactions  . Neomycin-Bacitracin Zn-Polymyx Rash    Current Outpatient Prescriptions  Medication Sig Dispense Refill  . Calcium Carbonate-Vitamin D (CALCIUM 600 + D PO) Take 1 tablet by mouth daily.      . cetirizine (ZYRTEC) 10 MG tablet Take 10 mg by mouth daily.      . fluticasone (FLONASE) 50 MCG/ACT nasal spray     . gabapentin (NEURONTIN) 100 MG capsule Take 1 capsule (100 mg total) by mouth at bedtime. 90 capsule 2  . SYNTHROID 150 MCG tablet   11   No current facility-administered medications for this visit.     OBJECTIVE: Young-appearing white woman who appears well Vitals:   12/04/15 1408  BP: 124/67  Pulse: (!) 102  Resp: 18  Temp: 98.1 F (36.7 C)     Body mass index is 36.88 kg/m.    ECOG FS: 0 Filed Weights   12/04/15 1408  Weight: 228 lb 8 oz (103.6 kg)   Sclerae unicteric, pupils round and equal Oropharynx clear and moist-- no thrush or other lesions No cervical or supraclavicular adenopathy, no palpable thyroid tissue Lungs no rales or rhonchi Heart regular rate and rhythm Abd soft, nontender, positive bowel sounds MSK no focal spinal  tenderness, no upper extremity lymphedema Neuro: nonfocal, well oriented, appropriate affect Breasts:right breast is benign. The left breast is status post lumpectomy followed by radiation. There is no evidence of local recurrence. Left axilla is benign.  LAB RESULTS: Lab Results  Component Value Date   WBC 5.9 11/27/2015   NEUTROABS 3.4 11/27/2015   HGB 13.9 11/27/2015   HCT 41.5 11/27/2015   MCV 89.6 11/27/2015   PLT 294 11/27/2015      Chemistry      Component Value Date/Time   NA 139 11/27/2015 0848   K 4.3 11/27/2015 0848   CL 103 05/25/2012 1457   CO2 26 11/27/2015 0848   BUN 16.2 11/27/2015 0848   CREATININE 0.8 11/27/2015 0848      Component Value Date/Time   CALCIUM 9.3 11/27/2015 0848   ALKPHOS 69 11/27/2015 0848   AST 14 11/27/2015 0848   ALT 15 11/27/2015 0848   BILITOT 0.50 11/27/2015 0848  STUDIES: Mr Breast Bilateral W Wo Contrast  Addendum Date: 11/29/2015   ADDENDUM REPORT: 11/29/2015 11:36 ADDENDUM: Comparison made to previous exams including breast MRIs dated 11/14/2014, 11/04/2013 and 09/03/2012. Electronically Signed   By: Franki Cabot M.D.   On: 11/29/2015 11:36   Result Date: 11/29/2015 CLINICAL DATA:  40 year old female with history of left breast cancer in 2010 status post lumpectomy and radiation therapy. Patient is BRCA positive with additional personal history of thyroid cancer and melanoma. LABS:  Not applicable EXAM: BILATERAL BREAST MRI WITH AND WITHOUT CONTRAST TECHNIQUE: Multiplanar, multisequence MR images of both breasts were obtained prior to and following the intravenous administration of 20 ml of MultiHance. THREE-DIMENSIONAL MR IMAGE RENDERING ON INDEPENDENT WORKSTATION: Three-dimensional MR images were rendered by post-processing of the original MR data on an independent workstation. The three-dimensional MR images were interpreted, and findings are reported in the following complete MRI report for this study. Three dimensional  images were evaluated at the independent DynaCad workstation COMPARISON:  Previous exam(s). FINDINGS: Breast composition: b. Scattered fibroglandular tissue. Background parenchymal enhancement: Mild Right breast: No mass or abnormal enhancement. Left breast: No mass or abnormal enhancement. Stable postsurgical changes. Lymph nodes: No abnormal appearing lymph nodes. Ancillary findings:  None. IMPRESSION: No MRI evidence of malignancy within either breast. Stable postsurgical changes within the left breast. RECOMMENDATION: Screening mammogram and screening breast MRI in 1 year. BI-RADS CATEGORY  2: Benign. Electronically Signed: By: Franki Cabot M.D. On: 11/29/2015 11:25   Mm Diag Breast Tomo Bilateral  Result Date: 11/16/2015 CLINICAL DATA:  Annual examination. The patient is 40 years old and has a history of left breast cancer, diagnosed in 2010. She underwent lumpectomy and completed radiation therapy and chemotherapy. She is asymptomatic. EXAM: 2D DIGITAL DIAGNOSTIC BILATERAL MAMMOGRAM WITH CAD AND ADJUNCT TOMO COMPARISON:  Previous exam(s). ACR Breast Density Category b: There are scattered areas of fibroglandular density. FINDINGS: Stable lumpectomy changes in the left breast. No mass, nonsurgical distortion, or suspicious microcalcification is identified in either breast to suggest malignancy. Mammographic images were processed with CAD. IMPRESSION: No evidence of malignancy in either breast. Lumpectomy changes of the left breast. RECOMMENDATION: Diagnostic mammogram is suggested in 1 year. (Code:[id number]-B-01Y) I have discussed the findings and recommendations with the patient. Results were also provided in writing at the conclusion of the visit. If applicable, a reminder letter will be sent to the patient regarding the next appointment. BI-RADS CATEGORY  2: Benign. Electronically Signed   By: Curlene Dolphin M.D.   On: 11/16/2015 09:37    ASSESSMENT: 40 y.o.  BRCA-1 positive [place] woman    (1) status  post post left lumpectomy and sentinel lymph node dissection November of 2010 for a T2 N1(mic), stage IB invasive ductal carcinoma, triple negative, with an MIB-1 of 78%, treated adjuvantly with carboplatin and paclitaxel x3 followed by dose dense doxorubicin and cyclophosphamide x4 followed by radiation completed August of 2011   (2) papillary thyroid carcinoma post total thyroidectomy in January of 2011 with 5 of 6 lymph nodes involved, status post radioiodine ablation, with post treatment hypothyroidism, followed through doctor Kerr's office   (3) status post removal of a nevoid melanoma, focally superficial level IV, Breslow depth 0.46 mm, from the mid to upper back, with margins initially involved 03/13/1998, status post excision of multiple dysplastic nevi since that time, followed by Dr. Tonia Brooms  (4) s/p BSO November 2011 with benign pathology  (5) BRCA1 carries a deleterious mutation ([id number]); there was no mutation in the BRCA2  gene (testing 11/04/2008)  PLAN: Marlinda is now 7 years out from definitive surgery for her breast cancer with no evidence of disease recurrence. This is very favorable.  We discussed surveillance for her BRCA 1 positivity. This MRI she just had Manda costing her $1900, since she has of $4000 tdeductible before her insurance kicks in. While yearly MRI is optimal, especially now that her breast densities category BI think we can do mammography twice a year with twice a year breast exam at least for the next year and then consider doing an MRI 2 years from now. She is agreeable to this plan.  IfsheseesDr.GatesinMayandseesmeinNovember and has mammograms in May and November, that would be optimal. I have gone ahead and placed the mammogram orders and also scheduled her a return visit with me in one year.  I suggested they read "the 36 hour day", which will be helpful to them regarding management of Aaron Edelman Smothers worsening dementia issues  Sabrina knows to call for  any other issues develop before her next visit here  Sophi Calligan C    12/04/2015

## 2015-12-05 ENCOUNTER — Other Ambulatory Visit: Payer: Self-pay | Admitting: Oncology

## 2015-12-06 DIAGNOSIS — Z23 Encounter for immunization: Secondary | ICD-10-CM | POA: Diagnosis not present

## 2015-12-06 DIAGNOSIS — F411 Generalized anxiety disorder: Secondary | ICD-10-CM | POA: Diagnosis not present

## 2015-12-07 ENCOUNTER — Other Ambulatory Visit: Payer: Self-pay | Admitting: *Deleted

## 2015-12-07 ENCOUNTER — Telehealth: Payer: Self-pay | Admitting: Oncology

## 2015-12-07 NOTE — Telephone Encounter (Signed)
Spoke with patient re mammo for 11/18/16 at the [facility]. Patient annual lab/fu information at f/u visit this week.  Spoke with Anderson Malta at [place] will not schedule patient for mammo on a six month interval as the recommendation from the last mammo is for a one year f/u and the reasoning in the order does not warrant a six month f/u.   Left message informing desk nurse and patient has also been made aware. Patient will call back in a few days and speak with desk nurse re recommendations.

## 2016-01-15 DIAGNOSIS — F411 Generalized anxiety disorder: Secondary | ICD-10-CM | POA: Diagnosis not present

## 2016-02-01 ENCOUNTER — Other Ambulatory Visit: Payer: Self-pay | Admitting: Oncology

## 2016-02-01 DIAGNOSIS — Z1509 Genetic susceptibility to other malignant neoplasm: Principal | ICD-10-CM

## 2016-02-01 DIAGNOSIS — Z1501 Genetic susceptibility to malignant neoplasm of breast: Secondary | ICD-10-CM

## 2016-02-01 DIAGNOSIS — C73 Malignant neoplasm of thyroid gland: Secondary | ICD-10-CM

## 2016-02-01 DIAGNOSIS — C50412 Malignant neoplasm of upper-outer quadrant of left female breast: Secondary | ICD-10-CM

## 2016-02-06 DIAGNOSIS — Z85820 Personal history of malignant melanoma of skin: Secondary | ICD-10-CM | POA: Diagnosis not present

## 2016-02-06 DIAGNOSIS — D225 Melanocytic nevi of trunk: Secondary | ICD-10-CM | POA: Diagnosis not present

## 2016-02-06 DIAGNOSIS — Z86018 Personal history of other benign neoplasm: Secondary | ICD-10-CM | POA: Diagnosis not present

## 2016-02-06 DIAGNOSIS — L814 Other melanin hyperpigmentation: Secondary | ICD-10-CM | POA: Diagnosis not present

## 2016-04-30 ENCOUNTER — Other Ambulatory Visit: Payer: Self-pay | Admitting: Oncology

## 2016-04-30 DIAGNOSIS — C50412 Malignant neoplasm of upper-outer quadrant of left female breast: Secondary | ICD-10-CM

## 2016-04-30 DIAGNOSIS — Z1509 Genetic susceptibility to other malignant neoplasm: Principal | ICD-10-CM

## 2016-04-30 DIAGNOSIS — C73 Malignant neoplasm of thyroid gland: Secondary | ICD-10-CM

## 2016-04-30 DIAGNOSIS — Z1501 Genetic susceptibility to malignant neoplasm of breast: Secondary | ICD-10-CM

## 2016-05-24 DIAGNOSIS — Z01419 Encounter for gynecological examination (general) (routine) without abnormal findings: Secondary | ICD-10-CM | POA: Diagnosis not present

## 2016-05-24 DIAGNOSIS — Z6836 Body mass index (BMI) 36.0-36.9, adult: Secondary | ICD-10-CM | POA: Diagnosis not present

## 2016-05-27 ENCOUNTER — Other Ambulatory Visit (HOSPITAL_BASED_OUTPATIENT_CLINIC_OR_DEPARTMENT_OTHER): Payer: BLUE CROSS/BLUE SHIELD

## 2016-05-27 DIAGNOSIS — Z171 Estrogen receptor negative status [ER-]: Principal | ICD-10-CM

## 2016-05-27 DIAGNOSIS — C50412 Malignant neoplasm of upper-outer quadrant of left female breast: Secondary | ICD-10-CM | POA: Diagnosis not present

## 2016-05-27 DIAGNOSIS — C50912 Malignant neoplasm of unspecified site of left female breast: Secondary | ICD-10-CM

## 2016-05-27 DIAGNOSIS — Z1509 Genetic susceptibility to other malignant neoplasm: Secondary | ICD-10-CM

## 2016-05-27 DIAGNOSIS — Z1501 Genetic susceptibility to malignant neoplasm of breast: Secondary | ICD-10-CM

## 2016-05-27 LAB — CBC WITH DIFFERENTIAL/PLATELET
BASO%: 0.9 % (ref 0.0–2.0)
Basophils Absolute: 0.1 10*3/uL (ref 0.0–0.1)
EOS ABS: 0.2 10*3/uL (ref 0.0–0.5)
EOS%: 2.6 % (ref 0.0–7.0)
HCT: 40.6 % (ref 34.8–46.6)
HGB: 14 g/dL (ref 11.6–15.9)
LYMPH%: 29.5 % (ref 14.0–49.7)
MCH: 30.8 pg (ref 25.1–34.0)
MCHC: 34.5 g/dL (ref 31.5–36.0)
MCV: 89.2 fL (ref 79.5–101.0)
MONO#: 0.5 10*3/uL (ref 0.1–0.9)
MONO%: 7.8 % (ref 0.0–14.0)
NEUT#: 3.8 10*3/uL (ref 1.5–6.5)
NEUT%: 59.2 % (ref 38.4–76.8)
Platelets: 289 10*3/uL (ref 145–400)
RBC: 4.55 10*6/uL (ref 3.70–5.45)
RDW: 12.7 % (ref 11.2–14.5)
WBC: 6.4 10*3/uL (ref 3.9–10.3)
lymph#: 1.9 10*3/uL (ref 0.9–3.3)

## 2016-05-27 LAB — COMPREHENSIVE METABOLIC PANEL
ALBUMIN: 3.8 g/dL (ref 3.5–5.0)
ALK PHOS: 68 U/L (ref 40–150)
ALT: 17 U/L (ref 0–55)
AST: 15 U/L (ref 5–34)
Anion Gap: 11 mEq/L (ref 3–11)
BUN: 15.3 mg/dL (ref 7.0–26.0)
CHLORIDE: 106 meq/L (ref 98–109)
CO2: 26 mEq/L (ref 22–29)
Calcium: 9.1 mg/dL (ref 8.4–10.4)
Creatinine: 0.8 mg/dL (ref 0.6–1.1)
Glucose: 96 mg/dl (ref 70–140)
POTASSIUM: 4.3 meq/L (ref 3.5–5.1)
Sodium: 142 mEq/L (ref 136–145)
Total Bilirubin: 0.49 mg/dL (ref 0.20–1.20)
Total Protein: 7 g/dL (ref 6.4–8.3)

## 2016-07-11 DIAGNOSIS — N390 Urinary tract infection, site not specified: Secondary | ICD-10-CM | POA: Diagnosis not present

## 2016-07-11 DIAGNOSIS — R350 Frequency of micturition: Secondary | ICD-10-CM | POA: Diagnosis not present

## 2016-07-26 ENCOUNTER — Other Ambulatory Visit: Payer: Self-pay | Admitting: Oncology

## 2016-07-26 DIAGNOSIS — Z1509 Genetic susceptibility to other malignant neoplasm: Principal | ICD-10-CM

## 2016-07-26 DIAGNOSIS — C73 Malignant neoplasm of thyroid gland: Secondary | ICD-10-CM

## 2016-07-26 DIAGNOSIS — Z1501 Genetic susceptibility to malignant neoplasm of breast: Secondary | ICD-10-CM

## 2016-07-26 DIAGNOSIS — C50412 Malignant neoplasm of upper-outer quadrant of left female breast: Secondary | ICD-10-CM

## 2016-08-27 DIAGNOSIS — H43393 Other vitreous opacities, bilateral: Secondary | ICD-10-CM | POA: Diagnosis not present

## 2016-10-15 DIAGNOSIS — E890 Postprocedural hypothyroidism: Secondary | ICD-10-CM | POA: Diagnosis not present

## 2016-10-18 DIAGNOSIS — Z85850 Personal history of malignant neoplasm of thyroid: Secondary | ICD-10-CM | POA: Diagnosis not present

## 2016-10-18 DIAGNOSIS — E890 Postprocedural hypothyroidism: Secondary | ICD-10-CM | POA: Diagnosis not present

## 2016-10-18 DIAGNOSIS — E669 Obesity, unspecified: Secondary | ICD-10-CM | POA: Diagnosis not present

## 2016-10-22 DIAGNOSIS — H1045 Other chronic allergic conjunctivitis: Secondary | ICD-10-CM | POA: Diagnosis not present

## 2016-10-23 ENCOUNTER — Other Ambulatory Visit: Payer: Self-pay | Admitting: Oncology

## 2016-10-23 DIAGNOSIS — C73 Malignant neoplasm of thyroid gland: Secondary | ICD-10-CM

## 2016-10-23 DIAGNOSIS — C50412 Malignant neoplasm of upper-outer quadrant of left female breast: Secondary | ICD-10-CM

## 2016-10-23 DIAGNOSIS — Z1501 Genetic susceptibility to malignant neoplasm of breast: Secondary | ICD-10-CM

## 2016-10-23 DIAGNOSIS — Z1509 Genetic susceptibility to other malignant neoplasm: Principal | ICD-10-CM

## 2016-11-18 ENCOUNTER — Ambulatory Visit
Admission: RE | Admit: 2016-11-18 | Discharge: 2016-11-18 | Disposition: A | Discharge status: Home / Self Care | Payer: BLUE CROSS/BLUE SHIELD | Source: Ambulatory Visit | Attending: Oncology | Admitting: Oncology

## 2016-11-18 DIAGNOSIS — Z1509 Genetic susceptibility to other malignant neoplasm: Secondary | ICD-10-CM

## 2016-11-18 DIAGNOSIS — Z171 Estrogen receptor negative status [ER-]: Principal | ICD-10-CM

## 2016-11-18 DIAGNOSIS — Z1501 Genetic susceptibility to malignant neoplasm of breast: Secondary | ICD-10-CM

## 2016-11-18 DIAGNOSIS — R922 Inconclusive mammogram: Secondary | ICD-10-CM | POA: Diagnosis not present

## 2016-11-18 DIAGNOSIS — C50412 Malignant neoplasm of upper-outer quadrant of left female breast: Secondary | ICD-10-CM

## 2016-11-18 DIAGNOSIS — C50912 Malignant neoplasm of unspecified site of left female breast: Secondary | ICD-10-CM

## 2016-11-18 HISTORY — DX: Personal history of antineoplastic chemotherapy: Z92.21

## 2016-11-18 HISTORY — DX: Personal history of irradiation: Z92.3

## 2016-11-22 DIAGNOSIS — E890 Postprocedural hypothyroidism: Secondary | ICD-10-CM | POA: Diagnosis not present

## 2016-11-26 NOTE — Progress Notes (Signed)
ID: Audrey Chambers   DOB: 27-Oct-1975  MR#: [id number]  OMA#:[id number]  PCP: Darcus Austin, MD GYN: Louretta Shorten SU: OTHER MD: [place] Blas  CHIEF COMPLAINT: BRCA positive estrogen receptor negative breast cancer  CURRENT TREATMENT: Observation   HISTORY OF PRESENT ILLNESS: From the original intake note:  In mid October of 2010 Audrey Chambers palpated a mass in her left breast. She brought this to Dr. [name] attention and he set her up for bilateral mammography October of 2010.  Dr. [name] was able to palpate a mobile nodule in the left breast at the 12:00 position which by mammography showed up as subtle questionable asymmetry. By ultrasound there was an irregular hypoechoic mass measuring 1.7 cm. Biopsy was performed the same day and showed ([id number]) an invasive mammary carcinoma which was estrogen and progesterone receptor negative, with an MIB-1 of 78% and no HER-2 amplification, the ratio being 0.91.   With this information the patient was referred to Dr. [name] and bilateral breast MRIs were obtained November 03, 2008. This showed a solitary mass in the left breast measuring 2.2 cm with no evidence of internal mammary or axillary lymph node positivity. At this point the patient was set up for genetic studies. She proved to be BRCA-1 positive. Scans showed no evidence of metastatic disease otherwise a right thyroid nodule noted at that time.   With this information the patient proceeded to left partial mastectomy with axillary lymph node sampling November 2010. The final pathology (S. CEA [id number]) showed a 2.1 cm invasive ductal carcinoma with negative and ample margins but with evidence of lymphovascular invasion. One out of 3 sentinel lymph nodes was involved by micrometastatic deposit. The tumor was grade 3   Her subsequent history is as detailed below  INTERVAL HISTORY: Audrey Chambers returns today for follow-up of her estrogen receptor negative BRCA1 associated breast cancer. She  is accompanied by her husband today. She reports that she is doing well overall.   Since her last visit to the office, she has had a bilateral diagnostic mammography with tomography completed at [facility] on 11/18/2016 with results of: No mammographic evidence of malignancy in either breast, status post left lumpectomy.   REVIEW OF SYSTEMS: Audrey Chambers reports that for exercise, she walks several times a week up to 1 mile at a time. She has a treadmill and an elliptical at home in a spare room. 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 For Thanksgiving, her husband's family came over which she enjoyed. She denies unusual headaches, visual changes, nausea, vomiting, or dizziness. There has been no unusual cough, phlegm production, or pleurisy. This been no change in bowel or bladder habits. She denies unexplained fatigue or unexplained weight loss, bleeding, rash, or fever. A detailed review of systems was otherwise stable.      PAST MEDICAL HISTORY: Past Medical History:  Diagnosis Date  . Breast CA ([place])    breast ca dx 11/10  . Melanoma of back ([place]) 2002   resolved  . Personal history of chemotherapy   . Personal history of radiation therapy   Significant for melanoma removal approximately 10 years ago from the upper back.  Dr. [name] did the original excision and Dr. [name] did the deeper surgery.  The patient tells me this was "superficial" and certainly being more than 10 years ago, this is almost certainly cured.  Polycystic ovaries. Status post exploratory  laparotomy for what sounds like it may have been mild endometriosis.    PAST SURGICAL HISTORY: Past Surgical History:  Procedure Laterality Date  . BREAST LUMPECTOMY Left 2010  . EXPLORATORY LAPAROTOMY  remote   for possible endometriosis  . THYROIDECTOMY  January 2011   for papillary thyroid CA    FAMILY HISTORY Family  History  Problem Relation Age of Onset  . Breast cancer Mother 76       survived  . Ovarian cancer Sister 63       died  The patient's father is alive at age 43.  Her mother had breast cancer diagnosed in her 15.  The patient's mother's only sister only sister had ovarian cancer diagnosed in her 14s and died from that disease.  The patient's mother is alive.   The patient has one brother and one sister, both in good health. The patient's sister has been tested for the BRCA mutation and was found to be positive. She has undergone bilateral salpingo-oophorectomy and according to the patient is being appropriately screened.  GYNECOLOGIC HISTORY: The patient is [place] P0 she never took oral contraceptives; s/p BSO November 2011 with benign pathology  SOCIAL HISTORY: (updated NOV 2013) She works part time at [employer] in [employer], [employer].  Her husband, [name], used to work for [employer] in [employer], but now they have opened their own business [employer], working primarily on [name] cars. [name] also works at [employer]. They have no children.  They have a dog and a cat at home.  The patient is not a church attender.     ADVANCED DIRECTIVES: Not in place  HEALTH MAINTENANCE: Social History   Tobacco Use  . Smoking status: Never Smoker  . Smokeless tobacco: Never Used  Substance Use Topics  . Alcohol use: No  . Drug use: No     Colonoscopy:  PAP: UTD/[facility]  Bone density:  Lipid panel:  Allergies  Allergen Reactions  . Neomycin-Bacitracin Zn-Polymyx Rash    Current Outpatient Medications  Medication Sig Dispense Refill  . Calcium Carbonate-Vitamin D (CALCIUM 600 + D PO) Take 1 tablet by mouth daily.      Marland Kitchen gabapentin (NEURONTIN) 100 MG capsule TAKE 1 CAPSULE(100 MG) BY MOUTH AT BEDTIME 90 capsule 0  . SYNTHROID 175 MCG tablet TK 1 T PO QAM ON AN EMPTY STOMACH  12   No current facility-administered medications for this visit.     OBJECTIVE:  Young-appearing white woman who appears well  Vitals:   12/02/16 1314  BP: 122/67  Pulse: (!) 103  Resp: 18  Temp: 98.4 F (36.9 C)  SpO2: 100%     Body mass index is 35.9 kg/m.    ECOG FS: 0 Filed Weights   12/02/16 1314  Weight: 222 lb 6.4 oz (100.9 kg)   Sclerae unicteric, EOMs intact Oropharynx clear and moist No cervical or supraclavicular adenopathy Lungs no rales or rhonchi Heart regular rate and rhythm Abd soft, nontender, positive bowel sounds MSK no focal spinal tenderness, no upper extremity lymphedema Neuro: nonfocal, well oriented, appropriate affect Breasts: The right breast is unremarkable.  The left breast is undergone lumpectomy and radiation, with no evidence of local recurrence.  Both axillae are benign.  LAB RESULTS: Lab Results  Component Value Date   WBC 7.5 12/02/2016   NEUTROABS 4.5 12/02/2016   HGB 13.4 12/02/2016   HCT 39.5 12/02/2016   MCV 89.4 12/02/2016   PLT 304 12/02/2016  Chemistry      Component Value Date/Time   NA 142 05/27/2016 0748   K 4.3 05/27/2016 0748   CL 103 05/25/2012 1457   CO2 26 05/27/2016 0748   BUN 15.3 05/27/2016 0748   CREATININE 0.8 05/27/2016 0748      Component Value Date/Time   CALCIUM 9.1 05/27/2016 0748   ALKPHOS 68 05/27/2016 0748   AST 15 05/27/2016 0748   ALT 17 05/27/2016 0748   BILITOT 0.49 05/27/2016 0748       STUDIES: Mm Diag Breast Tomo Bilateral  Result Date: 11/18/2016 CLINICAL DATA:  History of treated left breast cancer status post lumpectomy and radiation therapy in 2010. EXAM: 2D DIGITAL DIAGNOSTIC BILATERAL MAMMOGRAM WITH CAD AND ADJUNCT TOMO COMPARISON:  Previous exam(s). ACR Breast Density Category c: The breast tissue is heterogeneously dense, which may obscure small masses. FINDINGS: Mammographically, there are no suspicious masses, areas of nonsurgical architectural distortion or microcalcifications in either breast. Stable posttreatment changes in the left breast.  Mammographic images were processed with CAD. IMPRESSION: No mammographic evidence of malignancy in either breast, status post left lumpectomy. RECOMMENDATION: Screening mammogram in one year.(Code:SM-B-01Y) I have discussed the findings and recommendations with the patient. Results were also provided in writing at the conclusion of the visit. If applicable, a reminder letter will be sent to the patient regarding the next appointment. BI-RADS CATEGORY  2: Benign. Electronically Signed   By: Fidela Salisbury M.D.   On: 11/18/2016 09:11    ASSESSMENT: 41 y.o.  BRCA-1 positive [place] woman    (1) status post post left lumpectomy and sentinel lymph node dissection November of 2010 for a T2 N1(mic), stage IB invasive ductal carcinoma, triple negative, with an MIB-1 of 78%, treated adjuvantly with carboplatin and paclitaxel x3 followed by dose dense doxorubicin and cyclophosphamide x4 followed by radiation completed August of 2011   (2) papillary thyroid carcinoma post total thyroidectomy in January of 2011 with 5 of 6 lymph nodes involved, status post radioiodine ablation, with post treatment hypothyroidism, followed through doctor Kerr's office   (3) status post removal of a nevoid melanoma, focally superficial level IV, Breslow depth 0.46 mm, from the mid to upper back, with margins initially involved 03/13/1998, status post excision of multiple dysplastic nevi since that time, followed by Dr. Tonia Brooms  (4) s/p BSO November 2011 with benign pathology  (5) BRCA1 carries a deleterious mutation ([id number]); there was no mutation in the BRCA2 gene (testing 11/04/2008)  PLAN: Braniya is now 8 years out from definitive surgery for her breast cancer with no evidence of disease recurrence.  This is very favorable.  She continues at risk of breast cancer and given her mutation she is under intensified screening.  Last year because of cost issues we tried to get her to mammograms instead of one mammogram in  one MRI, but that did not work very well and she ended up having mammography only once a year.  This is really not adequate given her overall situation.  We again discussed the possibility of bilateral mastectomies.  She really does not want this.  I have sent a note to the breast center to try to get her scheduled for mammography late March, so she can have those results when she sees her gynecologist in April.  She will then return to see me in October and I have written an order for her to have her breast MRI shortly before that visit  She knows to call for any other issues that  may develop before then  Eriq Hufford, Virgie Dad, MD  12/02/16 1:30 PM Medical Oncology and Hematology [facility] [address] [place], [place] [address] Tel. [phone]    Fax. [phone]    This document serves as a record of services personally performed by Lurline Del, MD. It was created on his behalf by Steva Colder, a trained medical scribe. The creation of this record is based on the scribe's personal observations and the provider's statements to them.   I have reviewed the above documentation for accuracy and completeness, and I agree with the above.

## 2016-12-02 ENCOUNTER — Telehealth: Payer: Self-pay | Admitting: Oncology

## 2016-12-02 ENCOUNTER — Other Ambulatory Visit (HOSPITAL_BASED_OUTPATIENT_CLINIC_OR_DEPARTMENT_OTHER): Payer: BLUE CROSS/BLUE SHIELD

## 2016-12-02 ENCOUNTER — Ambulatory Visit (HOSPITAL_BASED_OUTPATIENT_CLINIC_OR_DEPARTMENT_OTHER): Payer: BLUE CROSS/BLUE SHIELD

## 2016-12-02 ENCOUNTER — Other Ambulatory Visit: Payer: Self-pay | Admitting: Oncology

## 2016-12-02 ENCOUNTER — Ambulatory Visit: Payer: BLUE CROSS/BLUE SHIELD | Admitting: Oncology

## 2016-12-02 VITALS — BP 122/67 | HR 103 | Temp 98.4°F | Resp 18 | Ht 66.0 in | Wt 222.4 lb

## 2016-12-02 DIAGNOSIS — Z85850 Personal history of malignant neoplasm of thyroid: Secondary | ICD-10-CM

## 2016-12-02 DIAGNOSIS — Z85820 Personal history of malignant melanoma of skin: Secondary | ICD-10-CM

## 2016-12-02 DIAGNOSIS — Z171 Estrogen receptor negative status [ER-]: Principal | ICD-10-CM

## 2016-12-02 DIAGNOSIS — Z9221 Personal history of antineoplastic chemotherapy: Secondary | ICD-10-CM | POA: Diagnosis not present

## 2016-12-02 DIAGNOSIS — Z23 Encounter for immunization: Secondary | ICD-10-CM | POA: Diagnosis not present

## 2016-12-02 DIAGNOSIS — Z853 Personal history of malignant neoplasm of breast: Secondary | ICD-10-CM

## 2016-12-02 DIAGNOSIS — Z923 Personal history of irradiation: Secondary | ICD-10-CM | POA: Diagnosis not present

## 2016-12-02 DIAGNOSIS — Z1509 Genetic susceptibility to other malignant neoplasm: Secondary | ICD-10-CM

## 2016-12-02 DIAGNOSIS — C4359 Malignant melanoma of other part of trunk: Secondary | ICD-10-CM

## 2016-12-02 DIAGNOSIS — C50412 Malignant neoplasm of upper-outer quadrant of left female breast: Secondary | ICD-10-CM

## 2016-12-02 DIAGNOSIS — C50912 Malignant neoplasm of unspecified site of left female breast: Secondary | ICD-10-CM

## 2016-12-02 DIAGNOSIS — Z1501 Genetic susceptibility to malignant neoplasm of breast: Secondary | ICD-10-CM

## 2016-12-02 DIAGNOSIS — C50919 Malignant neoplasm of unspecified site of unspecified female breast: Secondary | ICD-10-CM

## 2016-12-02 LAB — COMPREHENSIVE METABOLIC PANEL
ALBUMIN: 3.6 g/dL (ref 3.5–5.0)
ALT: 14 U/L (ref 0–55)
AST: 13 U/L (ref 5–34)
Alkaline Phosphatase: 60 U/L (ref 40–150)
Anion Gap: 8 mEq/L (ref 3–11)
BUN: 14.8 mg/dL (ref 7.0–26.0)
CO2: 26 meq/L (ref 22–29)
Calcium: 8.8 mg/dL (ref 8.4–10.4)
Chloride: 105 mEq/L (ref 98–109)
Creatinine: 0.8 mg/dL (ref 0.6–1.1)
GLUCOSE: 91 mg/dL (ref 70–140)
POTASSIUM: 3.6 meq/L (ref 3.5–5.1)
SODIUM: 140 meq/L (ref 136–145)
TOTAL PROTEIN: 7.1 g/dL (ref 6.4–8.3)
Total Bilirubin: 0.37 mg/dL (ref 0.20–1.20)

## 2016-12-02 LAB — CBC WITH DIFFERENTIAL/PLATELET
BASO%: 0.4 % (ref 0.0–2.0)
Basophils Absolute: 0 10*3/uL (ref 0.0–0.1)
EOS ABS: 0.1 10*3/uL (ref 0.0–0.5)
EOS%: 1.9 % (ref 0.0–7.0)
HCT: 39.5 % (ref 34.8–46.6)
HGB: 13.4 g/dL (ref 11.6–15.9)
LYMPH%: 31.4 % (ref 14.0–49.7)
MCH: 30.3 pg (ref 25.1–34.0)
MCHC: 33.9 g/dL (ref 31.5–36.0)
MCV: 89.4 fL (ref 79.5–101.0)
MONO#: 0.5 10*3/uL (ref 0.1–0.9)
MONO%: 6.4 % (ref 0.0–14.0)
NEUT%: 59.9 % (ref 38.4–76.8)
NEUTROS ABS: 4.5 10*3/uL (ref 1.5–6.5)
Platelets: 304 10*3/uL (ref 145–400)
RBC: 4.42 10*6/uL (ref 3.70–5.45)
RDW: 13.1 % (ref 11.2–14.5)
WBC: 7.5 10*3/uL (ref 3.9–10.3)
lymph#: 2.4 10*3/uL (ref 0.9–3.3)

## 2016-12-02 MED ORDER — INFLUENZA VAC SPLIT QUAD 0.5 ML IM SUSY
0.5000 mL | PREFILLED_SYRINGE | Freq: Once | INTRAMUSCULAR | Status: AC
  Administered 2016-12-02: 0.5 mL via INTRAMUSCULAR
  Filled 2016-12-02: qty 0.5

## 2016-12-02 NOTE — Telephone Encounter (Signed)
Gave patient avs report and appointments for October. Message to GM re no breast mri or mammo order. Per patient there should be a mammo order for 6 month f/u in addition to the breast mri. Patient also added to schedule for flu shot - ok per desk nurse.

## 2016-12-02 NOTE — Patient Instructions (Signed)

## 2017-01-13 DIAGNOSIS — E890 Postprocedural hypothyroidism: Secondary | ICD-10-CM | POA: Diagnosis not present

## 2017-01-15 ENCOUNTER — Other Ambulatory Visit: Payer: Self-pay | Admitting: Oncology

## 2017-01-15 DIAGNOSIS — Z1501 Genetic susceptibility to malignant neoplasm of breast: Secondary | ICD-10-CM

## 2017-01-15 DIAGNOSIS — Z1509 Genetic susceptibility to other malignant neoplasm: Principal | ICD-10-CM

## 2017-01-15 DIAGNOSIS — C50412 Malignant neoplasm of upper-outer quadrant of left female breast: Secondary | ICD-10-CM

## 2017-01-15 DIAGNOSIS — C73 Malignant neoplasm of thyroid gland: Secondary | ICD-10-CM

## 2017-01-28 DIAGNOSIS — J111 Influenza due to unidentified influenza virus with other respiratory manifestations: Secondary | ICD-10-CM | POA: Diagnosis not present

## 2017-01-28 DIAGNOSIS — R509 Fever, unspecified: Secondary | ICD-10-CM | POA: Diagnosis not present

## 2017-02-05 DIAGNOSIS — Z86018 Personal history of other benign neoplasm: Secondary | ICD-10-CM | POA: Diagnosis not present

## 2017-02-05 DIAGNOSIS — D225 Melanocytic nevi of trunk: Secondary | ICD-10-CM | POA: Diagnosis not present

## 2017-02-05 DIAGNOSIS — Z85820 Personal history of malignant melanoma of skin: Secondary | ICD-10-CM | POA: Diagnosis not present

## 2017-02-06 NOTE — Telephone Encounter (Signed)
Followed up with [facility] today re update in reaching out to patient for mammo/mri and also re notes in order surrounding order changes. Per [place] nurse are re talks btw physicians re when appointments will be scheduled and re voicemail messages left for patient.    Per [place] office will reach out to patient again closer to when appointment is due. Left message for patient updating her on status and calling [facility] for appointment as they have been trying to reach her.

## 2017-04-11 ENCOUNTER — Other Ambulatory Visit: Payer: Self-pay | Admitting: Oncology

## 2017-04-11 DIAGNOSIS — Z1501 Genetic susceptibility to malignant neoplasm of breast: Secondary | ICD-10-CM

## 2017-04-11 DIAGNOSIS — C73 Malignant neoplasm of thyroid gland: Secondary | ICD-10-CM

## 2017-04-11 DIAGNOSIS — Z1509 Genetic susceptibility to other malignant neoplasm: Principal | ICD-10-CM

## 2017-04-11 DIAGNOSIS — C50412 Malignant neoplasm of upper-outer quadrant of left female breast: Secondary | ICD-10-CM

## 2017-06-03 DIAGNOSIS — Z01419 Encounter for gynecological examination (general) (routine) without abnormal findings: Secondary | ICD-10-CM | POA: Diagnosis not present

## 2017-06-03 DIAGNOSIS — Z6835 Body mass index (BMI) 35.0-35.9, adult: Secondary | ICD-10-CM | POA: Diagnosis not present

## 2017-06-04 ENCOUNTER — Inpatient Hospital Stay: Admission: RE | Admit: 2017-06-04 | Payer: BLUE CROSS/BLUE SHIELD | Source: Ambulatory Visit

## 2017-06-12 ENCOUNTER — Ambulatory Visit
Admission: RE | Admit: 2017-06-12 | Discharge: 2017-06-12 | Disposition: A | Discharge status: Home / Self Care | Payer: BLUE CROSS/BLUE SHIELD | Source: Ambulatory Visit | Attending: Oncology | Admitting: Oncology

## 2017-06-12 ENCOUNTER — Encounter: Payer: Self-pay | Admitting: Oncology

## 2017-06-12 DIAGNOSIS — C50412 Malignant neoplasm of upper-outer quadrant of left female breast: Secondary | ICD-10-CM

## 2017-06-12 DIAGNOSIS — C50919 Malignant neoplasm of unspecified site of unspecified female breast: Secondary | ICD-10-CM

## 2017-06-12 DIAGNOSIS — C4359 Malignant melanoma of other part of trunk: Secondary | ICD-10-CM

## 2017-06-12 DIAGNOSIS — Z1501 Genetic susceptibility to malignant neoplasm of breast: Secondary | ICD-10-CM

## 2017-06-12 DIAGNOSIS — Z853 Personal history of malignant neoplasm of breast: Secondary | ICD-10-CM | POA: Diagnosis not present

## 2017-06-12 DIAGNOSIS — Z171 Estrogen receptor negative status [ER-]: Principal | ICD-10-CM

## 2017-06-12 MED ORDER — GADOBENATE DIMEGLUMINE 529 MG/ML IV SOLN
20.0000 mL | Freq: Once | INTRAVENOUS | Status: AC | PRN
  Administered 2017-06-12: 20 mL via INTRAVENOUS

## 2017-07-09 ENCOUNTER — Other Ambulatory Visit: Payer: Self-pay | Admitting: Oncology

## 2017-07-09 DIAGNOSIS — Z1501 Genetic susceptibility to malignant neoplasm of breast: Secondary | ICD-10-CM

## 2017-07-09 DIAGNOSIS — C73 Malignant neoplasm of thyroid gland: Secondary | ICD-10-CM

## 2017-07-09 DIAGNOSIS — C50412 Malignant neoplasm of upper-outer quadrant of left female breast: Secondary | ICD-10-CM

## 2017-07-09 DIAGNOSIS — Z1509 Genetic susceptibility to other malignant neoplasm: Principal | ICD-10-CM

## 2017-09-24 ENCOUNTER — Telehealth: Payer: Self-pay | Admitting: Oncology

## 2017-09-24 ENCOUNTER — Other Ambulatory Visit: Payer: Self-pay | Admitting: Oncology

## 2017-09-24 DIAGNOSIS — Z1231 Encounter for screening mammogram for malignant neoplasm of breast: Secondary | ICD-10-CM

## 2017-09-24 NOTE — Telephone Encounter (Signed)
Patient called to reschedule  °

## 2017-10-02 ENCOUNTER — Other Ambulatory Visit: Payer: Self-pay | Admitting: Oncology

## 2017-10-02 DIAGNOSIS — C73 Malignant neoplasm of thyroid gland: Secondary | ICD-10-CM

## 2017-10-02 DIAGNOSIS — C50412 Malignant neoplasm of upper-outer quadrant of left female breast: Secondary | ICD-10-CM

## 2017-10-02 DIAGNOSIS — Z1509 Genetic susceptibility to other malignant neoplasm: Principal | ICD-10-CM

## 2017-10-02 DIAGNOSIS — Z1501 Genetic susceptibility to malignant neoplasm of breast: Secondary | ICD-10-CM

## 2017-10-20 DIAGNOSIS — Z85850 Personal history of malignant neoplasm of thyroid: Secondary | ICD-10-CM | POA: Diagnosis not present

## 2017-10-20 DIAGNOSIS — E890 Postprocedural hypothyroidism: Secondary | ICD-10-CM | POA: Diagnosis not present

## 2017-10-22 DIAGNOSIS — E890 Postprocedural hypothyroidism: Secondary | ICD-10-CM | POA: Diagnosis not present

## 2017-10-22 DIAGNOSIS — E669 Obesity, unspecified: Secondary | ICD-10-CM | POA: Diagnosis not present

## 2017-10-22 DIAGNOSIS — Z85850 Personal history of malignant neoplasm of thyroid: Secondary | ICD-10-CM | POA: Diagnosis not present

## 2017-11-03 ENCOUNTER — Other Ambulatory Visit: Payer: BLUE CROSS/BLUE SHIELD

## 2017-11-03 ENCOUNTER — Ambulatory Visit: Payer: BLUE CROSS/BLUE SHIELD | Admitting: Oncology

## 2017-11-21 ENCOUNTER — Ambulatory Visit
Admission: RE | Admit: 2017-11-21 | Discharge: 2017-11-21 | Disposition: A | Discharge status: Home / Self Care | Payer: BLUE CROSS/BLUE SHIELD | Source: Ambulatory Visit | Attending: Oncology | Admitting: Oncology

## 2017-11-21 DIAGNOSIS — Z1231 Encounter for screening mammogram for malignant neoplasm of breast: Secondary | ICD-10-CM | POA: Diagnosis not present

## 2017-11-28 DIAGNOSIS — E890 Postprocedural hypothyroidism: Secondary | ICD-10-CM | POA: Diagnosis not present

## 2017-11-28 DIAGNOSIS — H11153 Pinguecula, bilateral: Secondary | ICD-10-CM | POA: Diagnosis not present

## 2017-12-15 NOTE — Progress Notes (Signed)
ID: Audrey Chambers   DOB: 05/25/1975  MR#: [id number]  CSN#:[id number]  PCP: Gates, Donna, MD GYN: David Lowe SU: OTHER MD: [Hope Gruber], Jeff Kerr  CHIEF COMPLAINT: BRCA positive estrogen receptor negative breast cancer  CURRENT TREATMENT: Observation   HISTORY OF PRESENT ILLNESS: From the original intake note:  In mid October of 2010 Audrey Chambers palpated a mass in her left breast. She brought this to Dr. Mezer's attention and he set her up for bilateral mammography October of 2010.  Dr. Jackson was able to palpate a mobile nodule in the left breast at the 12:00 position which by mammography showed up as subtle questionable asymmetry. By ultrasound there was an irregular hypoechoic mass measuring 1.7 cm. Biopsy was performed the same day and showed ([id number]) an invasive mammary carcinoma which was estrogen and progesterone receptor negative, with an MIB-1 of 78% and no HER-2 amplification, the ratio being 0.91.   With this information the patient was referred to Dr. Ingram and bilateral breast MRIs were obtained November 03, 2008. This showed a solitary mass in the left breast measuring 2.2 cm with no evidence of internal mammary or axillary lymph node positivity. At this point the patient was set up for genetic studies. She proved to be BRCA-1 positive. Scans showed no evidence of metastatic disease otherwise a right thyroid nodule noted at that time.   With this information the patient proceeded to left partial mastectomy with axillary lymph node sampling November 2010. The final pathology (S. [id number]) showed a 2.1 cm invasive ductal carcinoma with negative and ample margins but with evidence of lymphovascular invasion. One out of 3 sentinel lymph nodes was involved by micrometastatic deposit. The tumor was grade 3   Her subsequent history is as detailed below  INTERVAL HISTORY: [name] returns today for follow-up of her estrogen receptor negative, BRCA1 associated breast cancer.    The patient continues under observation.  Since her last visit here, she underwent a bilateral breast MRI with and without contrast on 06/12/2017, showing: Breast Density Category B. There are stable post lumpectomy changes in the left breast. There are no suspicious findings within either breast.  She also underwent a digital screening bilateral mammogram with tomography on 11/21/2017, showing: Breast Density Category B. There is no mammographic evidence of malignancy.    REVIEW OF SYSTEMS: [name] is doing well overall. Her mother-in-law has dementia and has been moved into assisted living at [facility]. Audrey Chambers visits her 6 days a week. Audrey Chambers is not completing any formal exercises, but she feels like she has been running around everywhere, and she does a lot of walking to complete her daily tasks. She has been feeling stressed out slightly. In cold weather, her feet bother her more than usual. The patient denies unusual headaches, visual changes, nausea, vomiting, or dizziness. There has been no unusual cough, phlegm production, or pleurisy. This been no change in bowel or bladder habits. The patient denies unexplained fatigue or unexplained weight loss, bleeding, rash, or fever. A detailed review of systems was otherwise noncontributory.    PAST MEDICAL HISTORY: Past Medical History:  Diagnosis Date  . Breast CA (HCC)    breast ca dx 11/10  . Melanoma of back (HCC) 2002   resolved  . Personal history of chemotherapy   . Personal history of radiation therapy   Significant for melanoma removal approximately 10 years ago from the upper back.  Dr. Hope Gruber did the original excision and Dr. Peter Young did the deeper surgery.    The patient tells me this was "superficial" and certainly being more than 10 years ago, this is almost certainly cured.  Polycystic ovaries. Status post exploratory laparotomy for what sounds like it may have been mild endometriosis.    PAST SURGICAL  HISTORY: Past Surgical History:  Procedure Laterality Date  . BREAST LUMPECTOMY Left 2010  . EXPLORATORY LAPAROTOMY  remote   for possible endometriosis  . THYROIDECTOMY  January 2011   for papillary thyroid CA    FAMILY HISTORY Family History  Problem Relation Age of Onset  . Breast cancer Mother 62       survived  . Ovarian cancer Sister 42       died  The patient's father is alive at age 63.  Her mother had breast cancer diagnosed in her sixties.  The patient's mother's only sister only sister had ovarian cancer diagnosed in her 40s and died from that disease.  The patient's mother is alive.   The patient has one brother and one sister, both in good health. The patient's sister has been tested for the BRCA mutation and was found to be positive. She has undergone bilateral salpingo-oophorectomy and according to the patient is being appropriately screened.  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  Her husband, Ryan, used to work for [employer] in the Service Department, but now they have opened their own business [employer], working primarily on [place] cars. Aneira also works at the shop. They have no children.  They have a dog and a cat at home.  The patient is not a church attender.     ADVANCED DIRECTIVES: Not in place  HEALTH MAINTENANCE: Social History   Tobacco Use  . Smoking status: Never Smoker  . Smokeless tobacco: Never Used  Substance Use Topics  . Alcohol use: No  . Drug use: No     Colonoscopy:  PAP: UTD/Mezer  Bone density:  Lipid panel:  Allergies  Allergen Reactions  . Neomycin-Bacitracin Zn-Polymyx Rash    Current Outpatient Medications  Medication Sig Dispense Refill  . Calcium Carbonate-Vitamin D (CALCIUM 600 + D PO) Take 1 tablet by mouth daily.      .  gabapentin (NEURONTIN) 100 MG capsule TAKE 1 CAPSULE(100 MG) BY MOUTH AT BEDTIME 90 capsule 0  . SYNTHROID 175 MCG tablet TK 1 T PO QAM ON AN EMPTY STOMACH  12   No current facility-administered medications for this visit.     OBJECTIVE: Young-appearing White woman in no acute distress  Vitals:   12/16/17 0835  BP: 118/73  Pulse: 97  Resp: 18  Temp: 98.8 F (37.1 C)  SpO2: 100%     Body mass index is 36.41 kg/m.    ECOG FS: 0 Filed Weights   12/16/17 0835  Weight: 225 lb 9.6 oz (102.3 kg)   Sclerae unicteric, pupils round and equal Oropharynx clear and moist No cervical or supraclavicular adenopathy Lungs no rales or rhonchi Heart regular rate and rhythm Abd soft, nontender, positive bowel sounds MSK no focal spinal tenderness, no upper extremity lymphedema Neuro: nonfocal, well oriented, appropriate affect Breasts: The right breast is benign.  The left breast is status post lumpectomy and radiation.  There is no evidence of local recurrence.  Both axillae are benign.  LAB RESULTS: Lab Results  Component Value Date     WBC 6.5 12/16/2017   NEUTROABS 3.8 12/16/2017   HGB 13.4 12/16/2017   HCT 40.1 12/16/2017   MCV 89.3 12/16/2017   PLT 307 12/16/2017      Chemistry      Component Value Date/Time   NA 140 12/02/2016 1303   K 3.6 12/02/2016 1303   CL 103 05/25/2012 1457   CO2 26 12/02/2016 1303   BUN 14.8 12/02/2016 1303   CREATININE 0.8 12/02/2016 1303      Component Value Date/Time   CALCIUM 8.8 12/02/2016 1303   ALKPHOS 60 12/02/2016 1303   AST 13 12/02/2016 1303   ALT 14 12/02/2016 1303   BILITOT 0.37 12/02/2016 1303       STUDIES: Mm 3d Screen Breast Bilateral  Result Date: 11/24/2017 CLINICAL DATA:  Screening. EXAM: DIGITAL SCREENING BILATERAL MAMMOGRAM WITH TOMO AND CAD COMPARISON:  Previous exam(s). ACR Breast Density Category b: There are scattered areas of fibroglandular density. FINDINGS: There are no findings suspicious for malignancy. Images  were processed with CAD. IMPRESSION: No mammographic evidence of malignancy. A result letter of this screening mammogram will be mailed directly to the patient. RECOMMENDATION: Screening mammogram in one year. (Code:SM-B-01Y) BI-RADS CATEGORY  1: Negative. Electronically Signed   By: Nolon Nations M.D.   On: 11/24/2017 11:55    ASSESSMENT: 42 y.o.  BRCA-1 positive [place] woman    (1) status post post left lumpectomy and sentinel lymph node dissection November of 2010 for a T2 N1(mic), stage IB invasive ductal carcinoma, triple negative, with an MIB-1 of 78%, treated adjuvantly with carboplatin and paclitaxel x3 followed by dose dense doxorubicin and cyclophosphamide x4 followed by radiation completed August of 2011   (2) papillary thyroid carcinoma post total thyroidectomy in January of 2011 with 5 of 6 lymph nodes involved, status post radioiodine ablation, with post treatment hypothyroidism, followed through doctor Kerr's office   (3) status post removal of a nevoid melanoma, focally superficial level IV, Breslow depth 0.46 mm, from the mid to upper back, with margins initially involved 03/13/1998, status post excision of multiple dysplastic nevi since that time, followed by Dr. Tonia Brooms  (4) s/p BSO November 2011 with benign pathology  (5) BRCA1 carries a deleterious mutation ([id number]); there was no mutation in the BRCA2 gene (testing 11/04/2008)  PLAN: Shirlie is now 9 years out from definitive surgery for breast cancer with no evidence of disease recurrence.  This is very favorable.  She continues under intensified screening.  Her breasts are little bit less dense which makes the mammograms more sensitive.  Nevertheless a yearly MRI is indicated given her mutation and high risk of developing another breast cancer in the future  I have gone ahead and entered the order for the mammogram in November and the breast MRI in June.  She will return to see me in 1 year  She knows to call for  any other issues that may develop before the next visit.  , Virgie Dad, MD  12/16/17 9:04 AM Medical Oncology and Hematology [facility] [address] [place], [place] [address] Tel. [phone]    Fax. [phone]    I, Jacqualyn Posey am acting as a Education administrator for Chauncey Cruel, MD.   I, Lurline Del MD, have reviewed the above documentation for accuracy and completeness, and I agree with the above.

## 2017-12-16 ENCOUNTER — Telehealth: Payer: Self-pay | Admitting: Oncology

## 2017-12-16 ENCOUNTER — Inpatient Hospital Stay (HOSPITAL_BASED_OUTPATIENT_CLINIC_OR_DEPARTMENT_OTHER): Payer: BLUE CROSS/BLUE SHIELD | Admitting: Oncology

## 2017-12-16 ENCOUNTER — Inpatient Hospital Stay: Payer: BLUE CROSS/BLUE SHIELD | Attending: Oncology

## 2017-12-16 VITALS — BP 118/73 | HR 97 | Temp 98.8°F | Resp 18 | Ht 66.0 in | Wt 225.6 lb

## 2017-12-16 DIAGNOSIS — Z1231 Encounter for screening mammogram for malignant neoplasm of breast: Secondary | ICD-10-CM

## 2017-12-16 DIAGNOSIS — Z85850 Personal history of malignant neoplasm of thyroid: Secondary | ICD-10-CM | POA: Insufficient documentation

## 2017-12-16 DIAGNOSIS — Z171 Estrogen receptor negative status [ER-]: Secondary | ICD-10-CM

## 2017-12-16 DIAGNOSIS — Z853 Personal history of malignant neoplasm of breast: Secondary | ICD-10-CM | POA: Diagnosis not present

## 2017-12-16 DIAGNOSIS — C50919 Malignant neoplasm of unspecified site of unspecified female breast: Secondary | ICD-10-CM

## 2017-12-16 DIAGNOSIS — Z85820 Personal history of malignant melanoma of skin: Secondary | ICD-10-CM

## 2017-12-16 DIAGNOSIS — C50412 Malignant neoplasm of upper-outer quadrant of left female breast: Secondary | ICD-10-CM

## 2017-12-16 DIAGNOSIS — Z1501 Genetic susceptibility to malignant neoplasm of breast: Secondary | ICD-10-CM

## 2017-12-16 DIAGNOSIS — C50912 Malignant neoplasm of unspecified site of left female breast: Secondary | ICD-10-CM

## 2017-12-16 DIAGNOSIS — C4359 Malignant melanoma of other part of trunk: Secondary | ICD-10-CM

## 2017-12-16 DIAGNOSIS — Z299 Encounter for prophylactic measures, unspecified: Secondary | ICD-10-CM

## 2017-12-16 DIAGNOSIS — Z1509 Genetic susceptibility to other malignant neoplasm: Secondary | ICD-10-CM

## 2017-12-16 DIAGNOSIS — Z23 Encounter for immunization: Secondary | ICD-10-CM | POA: Insufficient documentation

## 2017-12-16 DIAGNOSIS — C73 Malignant neoplasm of thyroid gland: Secondary | ICD-10-CM

## 2017-12-16 LAB — COMPREHENSIVE METABOLIC PANEL
ALBUMIN: 3.8 g/dL (ref 3.5–5.0)
ALT: 13 U/L (ref 0–44)
AST: 13 U/L — AB (ref 15–41)
Alkaline Phosphatase: 66 U/L (ref 38–126)
Anion gap: 8 (ref 5–15)
BUN: 16 mg/dL (ref 6–20)
CHLORIDE: 105 mmol/L (ref 98–111)
CO2: 27 mmol/L (ref 22–32)
CREATININE: 0.86 mg/dL (ref 0.44–1.00)
Calcium: 9.5 mg/dL (ref 8.9–10.3)
GFR calc non Af Amer: >60 mL/min (ref >60)
GLUCOSE: 75 mg/dL (ref 70–99)
Potassium: 4.2 mmol/L (ref 3.5–5.1)
SODIUM: 140 mmol/L (ref 135–145)
Total Bilirubin: 0.5 mg/dL (ref 0.3–1.2)
Total Protein: 7.2 g/dL (ref 6.5–8.1)

## 2017-12-16 LAB — CBC WITH DIFFERENTIAL/PLATELET
Abs Immature Granulocytes: 0.04 10*3/uL (ref 0.00–0.07)
BASOS ABS: 0 10*3/uL (ref 0.0–0.1)
Basophils Relative: 1 %
EOS PCT: 2 %
Eosinophils Absolute: 0.2 10*3/uL (ref 0.0–0.5)
HEMATOCRIT: 40.1 % (ref 36.0–46.0)
HEMOGLOBIN: 13.4 g/dL (ref 12.0–15.0)
Immature Granulocytes: 1 %
LYMPHS ABS: 2 10*3/uL (ref 0.7–4.0)
LYMPHS PCT: 31 %
MCH: 29.8 pg (ref 26.0–34.0)
MCHC: 33.4 g/dL (ref 30.0–36.0)
MCV: 89.3 fL (ref 80.0–100.0)
Monocytes Absolute: 0.5 10*3/uL (ref 0.1–1.0)
Monocytes Relative: 7 %
NRBC: 0 % (ref 0.0–0.2)
Neutro Abs: 3.8 10*3/uL (ref 1.7–7.7)
Neutrophils Relative %: 58 %
Platelets: 307 10*3/uL (ref 150–400)
RBC: 4.49 MIL/uL (ref 3.87–5.11)
RDW: 12.7 % (ref 11.5–15.5)
WBC: 6.5 10*3/uL (ref 4.0–10.5)

## 2017-12-16 MED ORDER — INFLUENZA VAC SPLIT QUAD 0.5 ML IM SUSY
0.5000 mL | PREFILLED_SYRINGE | Freq: Once | INTRAMUSCULAR | Status: AC
  Administered 2017-12-16: 0.5 mL via INTRAMUSCULAR

## 2017-12-16 MED ORDER — INFLUENZA VAC SPLIT QUAD 0.5 ML IM SUSY
PREFILLED_SYRINGE | INTRAMUSCULAR | Status: AC
  Filled 2017-12-16: qty 0.5

## 2017-12-16 NOTE — Telephone Encounter (Signed)
Gave avs and calendar ° °

## 2018-01-03 ENCOUNTER — Other Ambulatory Visit: Payer: Self-pay | Admitting: Hematology and Oncology

## 2018-01-03 DIAGNOSIS — C50412 Malignant neoplasm of upper-outer quadrant of left female breast: Secondary | ICD-10-CM

## 2018-01-03 DIAGNOSIS — Z1509 Genetic susceptibility to other malignant neoplasm: Principal | ICD-10-CM

## 2018-01-03 DIAGNOSIS — Z1501 Genetic susceptibility to malignant neoplasm of breast: Secondary | ICD-10-CM

## 2018-01-03 DIAGNOSIS — C73 Malignant neoplasm of thyroid gland: Secondary | ICD-10-CM

## 2018-02-11 DIAGNOSIS — Z86018 Personal history of other benign neoplasm: Secondary | ICD-10-CM | POA: Diagnosis not present

## 2018-02-11 DIAGNOSIS — D225 Melanocytic nevi of trunk: Secondary | ICD-10-CM | POA: Diagnosis not present

## 2018-02-11 DIAGNOSIS — L814 Other melanin hyperpigmentation: Secondary | ICD-10-CM | POA: Diagnosis not present

## 2018-02-11 DIAGNOSIS — Z85820 Personal history of malignant melanoma of skin: Secondary | ICD-10-CM | POA: Diagnosis not present

## 2018-02-23 ENCOUNTER — Telehealth: Payer: Self-pay | Admitting: *Deleted

## 2018-02-23 NOTE — Telephone Encounter (Signed)
This RN received VM from pt stating " I got a bill recently from [facility] showing I owed $510 for a facility fee " " I called the billing number and they couldn't tell me anything and told me I needed to call your office "  This RN forwarded message to Alphonzo Grieve and then called the patient to inform her of appropriate personal- and message was forwarded.  " yes when I called your office - I didn't know who to ask for- thank you "

## 2018-02-23 NOTE — Telephone Encounter (Signed)
See other entry 

## 2018-02-27 ENCOUNTER — Other Ambulatory Visit: Payer: Self-pay | Admitting: Family Medicine

## 2018-02-27 DIAGNOSIS — R1013 Epigastric pain: Secondary | ICD-10-CM | POA: Diagnosis not present

## 2018-03-02 ENCOUNTER — Ambulatory Visit
Admission: RE | Admit: 2018-03-02 | Discharge: 2018-03-02 | Disposition: A | Discharge status: Home / Self Care | Payer: BLUE CROSS/BLUE SHIELD | Source: Ambulatory Visit | Attending: Family Medicine | Admitting: Family Medicine

## 2018-03-02 DIAGNOSIS — R1013 Epigastric pain: Secondary | ICD-10-CM

## 2018-03-02 DIAGNOSIS — K7689 Other specified diseases of liver: Secondary | ICD-10-CM | POA: Diagnosis not present

## 2018-04-04 ENCOUNTER — Other Ambulatory Visit: Payer: Self-pay | Admitting: Oncology

## 2018-04-04 DIAGNOSIS — C50412 Malignant neoplasm of upper-outer quadrant of left female breast: Secondary | ICD-10-CM

## 2018-04-04 DIAGNOSIS — Z1509 Genetic susceptibility to other malignant neoplasm: Principal | ICD-10-CM

## 2018-04-04 DIAGNOSIS — Z1501 Genetic susceptibility to malignant neoplasm of breast: Secondary | ICD-10-CM

## 2018-04-04 DIAGNOSIS — C73 Malignant neoplasm of thyroid gland: Secondary | ICD-10-CM

## 2018-04-17 ENCOUNTER — Other Ambulatory Visit: Payer: Self-pay | Admitting: *Deleted

## 2018-04-17 DIAGNOSIS — Z1231 Encounter for screening mammogram for malignant neoplasm of breast: Secondary | ICD-10-CM

## 2018-06-19 DIAGNOSIS — Z01419 Encounter for gynecological examination (general) (routine) without abnormal findings: Secondary | ICD-10-CM | POA: Diagnosis not present

## 2018-06-19 DIAGNOSIS — Z6836 Body mass index (BMI) 36.0-36.9, adult: Secondary | ICD-10-CM | POA: Diagnosis not present

## 2018-06-26 ENCOUNTER — Ambulatory Visit
Admission: RE | Admit: 2018-06-26 | Discharge: 2018-06-26 | Disposition: A | Discharge status: Home / Self Care | Payer: BC Managed Care – PPO | Source: Ambulatory Visit | Attending: Oncology | Admitting: Oncology

## 2018-06-26 DIAGNOSIS — Z853 Personal history of malignant neoplasm of breast: Secondary | ICD-10-CM | POA: Diagnosis not present

## 2018-06-26 DIAGNOSIS — Z1231 Encounter for screening mammogram for malignant neoplasm of breast: Secondary | ICD-10-CM

## 2018-06-26 MED ORDER — GADOBUTROL 1 MMOL/ML IV SOLN
10.0000 mL | Freq: Once | INTRAVENOUS | Status: AC | PRN
  Administered 2018-06-26: 10 mL via INTRAVENOUS

## 2018-10-15 ENCOUNTER — Other Ambulatory Visit: Payer: Self-pay | Admitting: Oncology

## 2018-10-15 DIAGNOSIS — Z1231 Encounter for screening mammogram for malignant neoplasm of breast: Secondary | ICD-10-CM

## 2018-10-30 DIAGNOSIS — Z85850 Personal history of malignant neoplasm of thyroid: Secondary | ICD-10-CM | POA: Diagnosis not present

## 2018-10-30 DIAGNOSIS — E890 Postprocedural hypothyroidism: Secondary | ICD-10-CM | POA: Diagnosis not present

## 2018-11-06 DIAGNOSIS — E890 Postprocedural hypothyroidism: Secondary | ICD-10-CM | POA: Diagnosis not present

## 2018-11-06 DIAGNOSIS — Z85850 Personal history of malignant neoplasm of thyroid: Secondary | ICD-10-CM | POA: Diagnosis not present

## 2018-11-06 DIAGNOSIS — Z23 Encounter for immunization: Secondary | ICD-10-CM | POA: Diagnosis not present

## 2018-11-16 DIAGNOSIS — E890 Postprocedural hypothyroidism: Secondary | ICD-10-CM | POA: Diagnosis not present

## 2018-11-16 DIAGNOSIS — Z85850 Personal history of malignant neoplasm of thyroid: Secondary | ICD-10-CM | POA: Diagnosis not present

## 2018-11-27 ENCOUNTER — Other Ambulatory Visit: Payer: Self-pay

## 2018-11-27 ENCOUNTER — Ambulatory Visit
Admission: RE | Admit: 2018-11-27 | Discharge: 2018-11-27 | Disposition: A | Discharge status: Home / Self Care | Payer: BC Managed Care – PPO | Source: Ambulatory Visit | Attending: Oncology | Admitting: Oncology

## 2018-11-27 DIAGNOSIS — Z1231 Encounter for screening mammogram for malignant neoplasm of breast: Secondary | ICD-10-CM

## 2018-11-30 ENCOUNTER — Other Ambulatory Visit: Payer: Self-pay | Admitting: Internal Medicine

## 2018-11-30 DIAGNOSIS — Z85850 Personal history of malignant neoplasm of thyroid: Secondary | ICD-10-CM

## 2018-12-01 DIAGNOSIS — H11153 Pinguecula, bilateral: Secondary | ICD-10-CM | POA: Diagnosis not present

## 2018-12-10 ENCOUNTER — Ambulatory Visit
Admission: RE | Admit: 2018-12-10 | Discharge: 2018-12-10 | Disposition: A | Discharge status: Home / Self Care | Payer: BC Managed Care – PPO | Source: Ambulatory Visit | Attending: Internal Medicine | Admitting: Internal Medicine

## 2018-12-10 DIAGNOSIS — Z85850 Personal history of malignant neoplasm of thyroid: Secondary | ICD-10-CM

## 2018-12-16 ENCOUNTER — Other Ambulatory Visit: Payer: Self-pay

## 2018-12-16 DIAGNOSIS — Z171 Estrogen receptor negative status [ER-]: Secondary | ICD-10-CM

## 2018-12-16 DIAGNOSIS — C50412 Malignant neoplasm of upper-outer quadrant of left female breast: Secondary | ICD-10-CM

## 2018-12-16 NOTE — Progress Notes (Signed)
ID: [name]   DOB: Aug 23, 1975  MR#: [id number]  OXB#:[id number]  Patient Care Team: Darcus Austin, MD (Inactive) as PCP - General (Family Medicine) Ludene Stokke, Virgie Dad, MD as Consulting Physician (Oncology) Louretta Shorten, MD as Consulting Physician (Obstetrics and Gynecology) OTHER MD: [name], [name]  CHIEF COMPLAINT: BRCA positive estrogen receptor negative breast cancer  CURRENT TREATMENT: Observation   INTERVAL HISTORY: Audrey Chambers returns today for follow-up of her estrogen receptor negative, BRCA1 associated breast cancer. She continues under observation.  Since her last visit, she underwent breast MRI on 06/26/2018, which showed: breast composition B; no evidence for malignancy.  She also underwent bilateral screening mammography with tomography at [facility] on 11/27/2018 showing: breast density category B; no evidence of malignancy in either breast.  She underwent right upper quadrant abdomen ultrasound on 03/02/2018 for epigastric pain. This showed: tiny 11 mm mildly complicated cyst in the right hepatic lobe; no other abnormalities.  She also had some abnormal thyroid tests likely due to her concurrent use of biotin.  She underwent surveillance thyroid ultrasound on 12/10/2018.  Recall she is status post remote thyroidectomy.. This showed: stable ovoid tissue in the left thyroid bed, table since 2012.   REVIEW OF SYSTEMS: Kobie is not exercising regularly.  She does have a step counter but she does not use it.  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  Recall they do not have children or grandchildren so is staying "safe" is relatively easy for them except for issues at work.  A detailed review of systems today was otherwise stable.   HISTORY OF PRESENT ILLNESS: From the original intake note:  In mid October of 2010 Encarnacion palpated a mass in her left breast. She  brought this to Dr. [name] attention and he set her up for bilateral mammography October of 2010.  Dr. [name] was able to palpate a mobile nodule in the left breast at the 12:00 position which by mammography showed up as subtle questionable asymmetry. By ultrasound there was an irregular hypoechoic mass measuring 1.7 cm. Biopsy was performed the same day and showed ([id number]) an invasive mammary carcinoma which was estrogen and progesterone receptor negative, with an MIB-1 of 78% and no HER-2 amplification, the ratio being 0.91.   With this information the patient was referred to Dr. [name] and bilateral breast MRIs were obtained November 03, 2008. This showed a solitary mass in the left breast measuring 2.2 cm with no evidence of internal mammary or axillary lymph node positivity. At this point the patient was set up for genetic studies. She proved to be BRCA-1 positive. Scans showed no evidence of metastatic disease otherwise a right thyroid nodule noted at that time.   With this information the patient proceeded to left partial mastectomy with axillary lymph node sampling November 2010. The final pathology (S. [id number]) showed a 2.1 cm invasive ductal carcinoma with negative and ample margins but with evidence of lymphovascular invasion. One out of 3 sentinel lymph nodes was involved by micrometastatic deposit. The tumor was grade 3   Her subsequent history is as detailed below   PAST MEDICAL HISTORY: Past Medical History:  Diagnosis Date  . Breast CA ([facility])    breast ca dx 11/10  . Melanoma of back ([facility]) 2002   resolved  . Personal history of chemotherapy   . Personal history of radiation therapy   Significant  for melanoma removal approximately 10 years ago from the upper back.  Dr. [name] did the original excision and Dr. [name] did the deeper surgery.  The patient tells me this was "superficial" and certainly being more than 10 years ago, this is almost certainly cured.   Polycystic ovaries. Status post exploratory laparotomy for what sounds like it may have been mild endometriosis.     PAST SURGICAL HISTORY: Past Surgical History:  Procedure Laterality Date  . BREAST LUMPECTOMY Left 2010  . EXPLORATORY LAPAROTOMY  remote   for possible endometriosis  . THYROIDECTOMY  January 2011   for papillary thyroid CA    FAMILY HISTORY Family History  Problem Relation Age of Onset  . Breast cancer Mother 30       survived  . Ovarian cancer Sister 48       died  The patient's father was murdered in his home at age 30.  Her mother had breast cancer diagnosed in her 66.  She is 44 years old as of December 2020 the patient's mother's only sister only sister had ovarian cancer diagnosed in her 27s and died from that disease. The patient has one brother and one sister, both in good health. The patient's sister has been tested for the BRCA mutation and was found to be positive. She has undergone bilateral salpingo-oophorectomy and according to the patient is being appropriately screened.   GYNECOLOGIC HISTORY: The patient is [place] P0; she never took oral contraceptives; s/p BSO November 2011 with benign pathology   SOCIAL HISTORY: (updated NOV 2013) She worked part time at [employer] in Therapist, art.  Her husband, [name], used to work for [employer] in [employer], but now they have opened their own business [employer], working primarily on [name] cars. Adaly also works at [employer]. They have no children.  They have a dog and a cat at home.  The patient is not a church attender.      ADVANCED DIRECTIVES: Not in place   HEALTH MAINTENANCE: Social History   Tobacco Use  . Smoking status: Never Smoker  . Smokeless tobacco: Never Used  Substance Use Topics  . Alcohol use: No  . Drug use: No     Colonoscopy:  PAP: UTD/Mezer  Bone density:  Lipid panel:  Allergies  Allergen Reactions  . Neomycin-Bacitracin Zn-Polymyx  Rash    Current Outpatient Medications  Medication Sig Dispense Refill  . Calcium Carbonate-Vitamin D (CALCIUM 600 + D PO) Take 1 tablet by mouth daily.      Marland Kitchen gabapentin (NEURONTIN) 100 MG capsule TAKE 1 CAPSULE(100 MG) BY MOUTH AT BEDTIME 90 capsule 3  . SYNTHROID 175 MCG tablet TK 1 T PO QAM ON AN EMPTY STOMACH  12   No current facility-administered medications for this visit.    OBJECTIVE: Ell[place]e Sia woman who appears stated age  43:   12/17/18 0857  BP: (!) 131/50  Pulse: 97  Resp: 18  Temp: 97.8 F (36.6 C)  SpO2: 100%     Body mass index is 36.09 kg/m.    ECOG FS: 0 Filed Weights   12/17/18 0857  Weight: 223 lb 9.6 oz (101.4 kg)    Sclerae unicteric, EOMs intact Wearing a mask No cervical or supraclavicular adenopathy Lungs no rales or rhonchi Heart regular rate and rhythm Abd soft, nontender, positive bowel sounds MSK no focal spinal tenderness, no upper extremity lymphedema Neuro: nonfocal, well oriented, appropriate affect Breasts: The right breast is  unremarkable.  Left breast is status post lumpectomy and radiation.  There is no evidence of disease recurrence.  Both axillae are benign.   LAB RESULTS: Lab Results  Component Value Date   WBC 6.6 12/17/2018   NEUTROABS 3.7 12/17/2018   HGB 14.0 12/17/2018   HCT 42.4 12/17/2018   MCV 90.0 12/17/2018   PLT 323 12/17/2018      Chemistry      Component Value Date/Time   NA 140 12/16/2017 0822   NA 140 12/02/2016 1303   K 4.2 12/16/2017 0822   K 3.6 12/02/2016 1303   CL 105 12/16/2017 0822   CL 103 05/25/2012 1457   CO2 27 12/16/2017 0822   CO2 26 12/02/2016 1303   BUN 16 12/16/2017 0822   BUN 14.8 12/02/2016 1303   CREATININE 0.86 12/16/2017 0822   CREATININE 0.8 12/02/2016 1303      Component Value Date/Time   CALCIUM 9.5 12/16/2017 0822   CALCIUM 8.8 12/02/2016 1303   ALKPHOS 66 12/16/2017 0822   ALKPHOS 60 12/02/2016 1303   AST 13 (L) 12/16/2017 0822   AST 13 12/02/2016  1303   ALT 13 12/16/2017 0822   ALT 14 12/02/2016 1303   BILITOT 0.5 12/16/2017 0822   BILITOT 0.37 12/02/2016 1303       STUDIES: MM DIGITAL SCREENING BILATERAL  Result Date: 11/30/2018 CLINICAL DATA:  Screening. EXAM: DIGITAL SCREENING BILATERAL MAMMOGRAM WITH CAD COMPARISON:  Previous exam(s). ACR Breast Density Category b: There are scattered areas of fibroglandular density. FINDINGS: There are no findings suspicious for malignancy. Images were processed with CAD. IMPRESSION: No mammographic evidence of malignancy. A result letter of this screening mammogram will be mailed directly to the patient. RECOMMENDATION: Screening mammogram in one year. (Code:SM-B-01Y) BI-RADS CATEGORY  1: Negative. Electronically Signed   By: Audie Pinto M.D.   On: 11/30/2018 15:17   US THYROID  Result Date: 12/11/2018 CLINICAL DATA:  History of papillary thyroid carcinoma and status post complete thyroidectomy in 2011. EXAM: ULTRASOUND OF HEAD/NECK SOFT TISSUES TECHNIQUE: Ultrasound examination of the head and neck soft tissues was performed in the area of clinical concern. COMPARISON:  10/18/2014 and prior studies. FINDINGS: Stable small ovoid tissue in the left thyroid bed measures approximately 1.2 x 0.4 x 0.6 cm and is ovoid in shape. No abnormal lymph nodes identified. IMPRESSION: Stable ovoid tissue in the left thyroid bed. This has been stable since 2012. Electronically Signed   By: Aletta Edouard M.D.   On: 12/11/2018 09:22    ASSESSMENT: 43 y.o.  BRCA-1 positive Audrey Chambers woman    (1) status post post left lumpectomy and sentinel lymph node dissection November of 2010 for a T2 N1(mic), stage IB invasive ductal carcinoma, triple negative, with an MIB-1 of 78%, treated adjuvantly with carboplatin and paclitaxel x3 followed by dose dense doxorubicin and cyclophosphamide x4 followed by radiation completed August of 2011   (2) papillary thyroid carcinoma post total thyroidectomy in January of 2011  with 5 of 6 lymph nodes involved, status post radioiodine ablation, with post treatment hypothyroidism, followed through doctor Kerr's office   (3) status post removal of a nevoid melanoma, focally superficial level IV, Breslow depth 0.46 mm, from the mid to upper back, with margins initially involved 03/13/1998, status post excision of multiple dysplastic nevi since that time, followed by Dr. Tonia Brooms  (4) s/p BSO November 2011 with benign pathology  (5) BRCA1 carries a deleterious mutation ([id number]); there was no mutation in the BRCA2 gene (testing 11/04/2008)  PLAN:  Marlaine is now 10 years out from definitive surgery for her breast cancer which is very favorable.  We are continuing to follow her because of her BRCA mutation.  However her breasts are now density category B.  I think we can go to every other year MRI, which she is agreeable to.  Note that she had to pay $1000 out-of-pocket last year for her MRI  Accordingly she will not have an MRI in the spring 2021.  She will have her next mammogram in November and she will return to see me a year from now.  She knows to call for any other issue that may develop before then.   Sandia Pfund, Virgie Dad, MD  12/17/18 9:13 AM Medical Oncology and Hematology [facility] [place], [place] [address] Tel. [phone]    Fax. [phone]    I, Wilburn Mylar, am acting as scribe for Dr. Virgie Dad. Kassity Woodson.  I, Lurline Del MD, have reviewed the above documentation for accuracy and completeness, and I agree with the above.

## 2018-12-17 ENCOUNTER — Telehealth: Payer: Self-pay | Admitting: Oncology

## 2018-12-17 ENCOUNTER — Inpatient Hospital Stay: Payer: BC Managed Care – PPO | Admitting: Oncology

## 2018-12-17 ENCOUNTER — Other Ambulatory Visit: Payer: Self-pay

## 2018-12-17 ENCOUNTER — Inpatient Hospital Stay: Payer: BC Managed Care – PPO | Attending: Oncology

## 2018-12-17 VITALS — BP 131/50 | HR 97 | Temp 97.8°F | Resp 18 | Ht 66.0 in | Wt 223.6 lb

## 2018-12-17 DIAGNOSIS — E890 Postprocedural hypothyroidism: Secondary | ICD-10-CM | POA: Insufficient documentation

## 2018-12-17 DIAGNOSIS — C50919 Malignant neoplasm of unspecified site of unspecified female breast: Secondary | ICD-10-CM

## 2018-12-17 DIAGNOSIS — Z171 Estrogen receptor negative status [ER-]: Secondary | ICD-10-CM | POA: Diagnosis not present

## 2018-12-17 DIAGNOSIS — C50412 Malignant neoplasm of upper-outer quadrant of left female breast: Secondary | ICD-10-CM

## 2018-12-17 DIAGNOSIS — Z853 Personal history of malignant neoplasm of breast: Secondary | ICD-10-CM | POA: Insufficient documentation

## 2018-12-17 DIAGNOSIS — Z8041 Family history of malignant neoplasm of ovary: Secondary | ICD-10-CM | POA: Insufficient documentation

## 2018-12-17 DIAGNOSIS — Z85820 Personal history of malignant melanoma of skin: Secondary | ICD-10-CM | POA: Diagnosis not present

## 2018-12-17 DIAGNOSIS — Z803 Family history of malignant neoplasm of breast: Secondary | ICD-10-CM | POA: Insufficient documentation

## 2018-12-17 DIAGNOSIS — Z90722 Acquired absence of ovaries, bilateral: Secondary | ICD-10-CM | POA: Diagnosis not present

## 2018-12-17 DIAGNOSIS — Z9221 Personal history of antineoplastic chemotherapy: Secondary | ICD-10-CM | POA: Diagnosis not present

## 2018-12-17 DIAGNOSIS — Z1501 Genetic susceptibility to malignant neoplasm of breast: Secondary | ICD-10-CM | POA: Diagnosis not present

## 2018-12-17 DIAGNOSIS — Z79899 Other long term (current) drug therapy: Secondary | ICD-10-CM | POA: Diagnosis not present

## 2018-12-17 DIAGNOSIS — Z923 Personal history of irradiation: Secondary | ICD-10-CM | POA: Diagnosis not present

## 2018-12-17 DIAGNOSIS — Z85850 Personal history of malignant neoplasm of thyroid: Secondary | ICD-10-CM | POA: Diagnosis not present

## 2018-12-17 LAB — CBC WITH DIFFERENTIAL (CANCER CENTER ONLY)
Abs Immature Granulocytes: 0.02 10*3/uL (ref 0.00–0.07)
Basophils Absolute: 0 10*3/uL (ref 0.0–0.1)
Basophils Relative: 1 %
Eosinophils Absolute: 0.1 10*3/uL (ref 0.0–0.5)
Eosinophils Relative: 2 %
HCT: 42.4 % (ref 36.0–46.0)
Hemoglobin: 14 g/dL (ref 12.0–15.0)
Immature Granulocytes: 0 %
Lymphocytes Relative: 33 %
Lymphs Abs: 2.2 10*3/uL (ref 0.7–4.0)
MCH: 29.7 pg (ref 26.0–34.0)
MCHC: 33 g/dL (ref 30.0–36.0)
MCV: 90 fL (ref 80.0–100.0)
Monocytes Absolute: 0.5 10*3/uL (ref 0.1–1.0)
Monocytes Relative: 7 %
Neutro Abs: 3.7 10*3/uL (ref 1.7–7.7)
Neutrophils Relative %: 57 %
Platelet Count: 323 10*3/uL (ref 150–400)
RBC: 4.71 MIL/uL (ref 3.87–5.11)
RDW: 13.1 % (ref 11.5–15.5)
WBC Count: 6.6 10*3/uL (ref 4.0–10.5)
nRBC: 0 % (ref 0.0–0.2)

## 2018-12-17 LAB — CMP (CANCER CENTER ONLY)
ALT: 15 U/L (ref 0–44)
AST: 14 U/L — ABNORMAL LOW (ref 15–41)
Albumin: 4 g/dL (ref 3.5–5.0)
Alkaline Phosphatase: 65 U/L (ref 38–126)
Anion gap: 9 (ref 5–15)
BUN: 20 mg/dL (ref 6–20)
CO2: 28 mmol/L (ref 22–32)
Calcium: 9.2 mg/dL (ref 8.9–10.3)
Chloride: 104 mmol/L (ref 98–111)
Creatinine: 0.8 mg/dL (ref 0.44–1.00)
GFR, Est AFR Am: >60 mL/min (ref >60)
GFR, Estimated: >60 mL/min (ref >60)
Glucose, Bld: 97 mg/dL (ref 70–99)
Potassium: 4.6 mmol/L (ref 3.5–5.1)
Sodium: 141 mmol/L (ref 135–145)
Total Bilirubin: 0.4 mg/dL (ref 0.3–1.2)
Total Protein: 7.5 g/dL (ref 6.5–8.1)

## 2018-12-17 NOTE — Telephone Encounter (Signed)
I talk with patient regarding schedule  

## 2018-12-18 ENCOUNTER — Other Ambulatory Visit: Payer: Self-pay | Admitting: Oncology

## 2019-02-16 DIAGNOSIS — D225 Melanocytic nevi of trunk: Secondary | ICD-10-CM | POA: Diagnosis not present

## 2019-02-16 DIAGNOSIS — Z23 Encounter for immunization: Secondary | ICD-10-CM | POA: Diagnosis not present

## 2019-02-16 DIAGNOSIS — L814 Other melanin hyperpigmentation: Secondary | ICD-10-CM | POA: Diagnosis not present

## 2019-02-16 DIAGNOSIS — Z85820 Personal history of malignant melanoma of skin: Secondary | ICD-10-CM | POA: Diagnosis not present

## 2019-02-16 DIAGNOSIS — Z86018 Personal history of other benign neoplasm: Secondary | ICD-10-CM | POA: Diagnosis not present

## 2019-02-16 DIAGNOSIS — M545 Low back pain: Secondary | ICD-10-CM | POA: Diagnosis not present

## 2019-03-31 ENCOUNTER — Other Ambulatory Visit: Payer: Self-pay | Admitting: Adult Health

## 2019-03-31 DIAGNOSIS — Z1501 Genetic susceptibility to malignant neoplasm of breast: Secondary | ICD-10-CM

## 2019-03-31 DIAGNOSIS — C73 Malignant neoplasm of thyroid gland: Secondary | ICD-10-CM

## 2019-03-31 DIAGNOSIS — C50412 Malignant neoplasm of upper-outer quadrant of left female breast: Secondary | ICD-10-CM

## 2019-04-27 DIAGNOSIS — Z13220 Encounter for screening for lipoid disorders: Secondary | ICD-10-CM | POA: Diagnosis not present

## 2019-04-27 DIAGNOSIS — Z5181 Encounter for therapeutic drug level monitoring: Secondary | ICD-10-CM | POA: Diagnosis not present

## 2019-04-27 DIAGNOSIS — E890 Postprocedural hypothyroidism: Secondary | ICD-10-CM | POA: Diagnosis not present

## 2019-04-27 DIAGNOSIS — Z0000 Encounter for general adult medical examination without abnormal findings: Secondary | ICD-10-CM | POA: Diagnosis not present

## 2019-09-30 DIAGNOSIS — Z6837 Body mass index (BMI) 37.0-37.9, adult: Secondary | ICD-10-CM | POA: Diagnosis not present

## 2019-09-30 DIAGNOSIS — Z01419 Encounter for gynecological examination (general) (routine) without abnormal findings: Secondary | ICD-10-CM | POA: Diagnosis not present

## 2019-09-30 DIAGNOSIS — N952 Postmenopausal atrophic vaginitis: Secondary | ICD-10-CM | POA: Diagnosis not present

## 2019-09-30 DIAGNOSIS — Z1501 Genetic susceptibility to malignant neoplasm of breast: Secondary | ICD-10-CM | POA: Diagnosis not present

## 2019-10-29 DIAGNOSIS — E890 Postprocedural hypothyroidism: Secondary | ICD-10-CM | POA: Diagnosis not present

## 2019-11-05 DIAGNOSIS — Z7189 Other specified counseling: Secondary | ICD-10-CM | POA: Diagnosis not present

## 2019-11-05 DIAGNOSIS — E890 Postprocedural hypothyroidism: Secondary | ICD-10-CM | POA: Diagnosis not present

## 2019-11-05 DIAGNOSIS — Z85850 Personal history of malignant neoplasm of thyroid: Secondary | ICD-10-CM | POA: Diagnosis not present

## 2019-11-22 DIAGNOSIS — S93601A Unspecified sprain of right foot, initial encounter: Secondary | ICD-10-CM | POA: Diagnosis not present

## 2019-11-30 ENCOUNTER — Other Ambulatory Visit: Payer: Self-pay

## 2019-11-30 ENCOUNTER — Ambulatory Visit
Admission: RE | Admit: 2019-11-30 | Discharge: 2019-11-30 | Disposition: A | Discharge status: Home / Self Care | Payer: BC Managed Care – PPO | Source: Ambulatory Visit | Attending: Oncology | Admitting: Oncology

## 2019-11-30 DIAGNOSIS — Z1231 Encounter for screening mammogram for malignant neoplasm of breast: Secondary | ICD-10-CM | POA: Diagnosis not present

## 2019-11-30 DIAGNOSIS — C50412 Malignant neoplasm of upper-outer quadrant of left female breast: Secondary | ICD-10-CM

## 2019-11-30 DIAGNOSIS — C50919 Malignant neoplasm of unspecified site of unspecified female breast: Secondary | ICD-10-CM

## 2019-11-30 DIAGNOSIS — Z171 Estrogen receptor negative status [ER-]: Secondary | ICD-10-CM

## 2019-12-07 DIAGNOSIS — H52223 Regular astigmatism, bilateral: Secondary | ICD-10-CM | POA: Diagnosis not present

## 2019-12-07 DIAGNOSIS — H1045 Other chronic allergic conjunctivitis: Secondary | ICD-10-CM | POA: Diagnosis not present

## 2019-12-17 ENCOUNTER — Other Ambulatory Visit: Payer: Self-pay

## 2019-12-17 DIAGNOSIS — C50412 Malignant neoplasm of upper-outer quadrant of left female breast: Secondary | ICD-10-CM

## 2019-12-19 NOTE — Progress Notes (Signed)
ID: Audrey Chambers   DOB: 22-Mar-1975  MR#: [id number]  MPN#:[id number]  Patient Care Team: Darcus Austin, MD (Inactive) as PCP - General (Family Medicine) Magrinat, Virgie Dad, MD as Consulting Physician (Oncology) Louretta Shorten, MD as Consulting Physician (Obstetrics and Gynecology) OTHER MD: [name], [name]  CHIEF COMPLAINT: BRCA positive, estrogen receptor negative breast cancer  CURRENT TREATMENT: Observation   INTERVAL HISTORY: Audrey Chambers returns today for follow-up of her estrogen receptor negative, BRCA1 associated breast cancer. She continues under observation.   Since her last visit, she underwent bilateral screening mammography with tomography at [place] on 11/30/2019 showing: breast density category B; no evidence of malignancy in either breast.  She will now get breast MRI every other year, next due 2022.   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  She walks maybe twice a week that may be 20 minutes at a time.  She otherwise does not exercise.  Detailed review of systems today was otherwise unremarkable   COVID 19 VACCINATION STATUS: Refuses vaccinations for SAR S Covid 2   HISTORY OF PRESENT ILLNESS: From the original intake note:  In mid October of 2010 Audrey Chambers palpated a mass in her left breast. She brought this to Dr. [name] attention and he set her up for bilateral mammography October of 2010.  Dr. [name] was able to palpate a mobile nodule in the left breast at the 12:00 position which by mammography showed up as subtle questionable asymmetry. By ultrasound there was an irregular hypoechoic mass measuring 1.7 cm. Biopsy was performed the same day and showed ([id number]) an invasive mammary carcinoma which was estrogen and progesterone receptor negative, with an MIB-1 of 78% and no HER-2 amplification, the ratio being 0.91.   With this information the patient was referred  to Dr. [name] and bilateral breast MRIs were obtained November 03, 2008. This showed a solitary mass in the left breast measuring 2.2 cm with no evidence of internal mammary or axillary lymph node positivity. At this point the patient was set up for genetic studies. She proved to be BRCA-1 positive. Scans showed no evidence of metastatic disease otherwise a right thyroid nodule noted at that time.   With this information the patient proceeded to left partial mastectomy with axillary lymph node sampling November 2010. The final pathology (S. [id number]) showed a 2.1 cm invasive ductal carcinoma with negative and ample margins but with evidence of lymphovascular invasion. One out of 3 sentinel lymph nodes was involved by micrometastatic deposit. The tumor was grade 3   Her subsequent history is as detailed below   PAST MEDICAL HISTORY: Past Medical History:  Diagnosis Date   Breast CA ([place])    breast ca dx 11/10   Melanoma of back ([place]) 2002   resolved   Personal history of chemotherapy    Personal history of radiation therapy   Significant for melanoma removal approximately 10 years ago from the upper back.  Dr. [name] did the original excision and Dr. [name] did the deeper surgery.  The patient tells me this was superficial and certainly being more than 10 years ago, this is almost certainly cured.  Polycystic ovaries. Status post exploratory laparotomy for what sounds like it may have been mild endometriosis.     PAST SURGICAL HISTORY: Past Surgical History:  Procedure Laterality Date   BREAST LUMPECTOMY Left 2010   EXPLORATORY LAPAROTOMY  remote  for possible endometriosis   THYROIDECTOMY  January 2011   for papillary thyroid CA    FAMILY HISTORY Family History  Problem Relation Age of Onset   Breast cancer Mother 10       survived   Ovarian cancer Sister 46       died  The patients father was murdered in his home at age 20.  Her mother had breast cancer  diagnosed in her 50.  She is 45 years old as of December 2020 the patient's mother's only sister only sister had ovarian cancer diagnosed in her 65s and died from that disease. The patient has one brother and one sister, both in good health. The patient's sister has been tested for the BRCA mutation and was found to be positive. She has undergone bilateral salpingo-oophorectomy and according to the patient is being appropriately screened.   GYNECOLOGIC HISTORY: The patient is [place] P0; she never took oral contraceptives; s/p BSO November 2011 with benign pathology   SOCIAL HISTORY: (updated December 2021 She worked part time at [employer] in [employer], art.  Her husband, [name], used to work for [employer] in [employer], but now they have opened their own business [employer], working primarily on [name] cars. Amara also works at [employer]. They have no children.  The patient is not a church attender.      ADVANCED DIRECTIVES: In the absence of any documents to the contrary the patient's husband is her healthcare power of attorney   HEALTH MAINTENANCE: Social History   Tobacco Use   Smoking status: Never Smoker   Smokeless tobacco: Never Used  Substance Use Topics   Alcohol use: No   Drug use: No     Colonoscopy:  PAP: UTD/Mezer  Bone density:  Lipid panel:  Allergies  Allergen Reactions   Neomycin-Bacitracin Zn-Polymyx Rash    Current Outpatient Medications  Medication Sig Dispense Refill   Calcium Carbonate-Vitamin D (CALCIUM 600 + D PO) Take 1 tablet by mouth daily.       gabapentin (NEURONTIN) 100 MG capsule TAKE 1 CAPSULE(100 MG) BY MOUTH AT BEDTIME 90 capsule 3   SYNTHROID 175 MCG tablet TK 1 T PO QAM ON AN EMPTY STOMACH  12   No current facility-administered medications for this visit.    OBJECTIVE: White woman who appears well  Vitals:   12/20/19 0843  BP: 126/68  Pulse: 96  Resp: 17  Temp: 98.3 F (36.8 C)  SpO2:  99%     Body mass index is 37.62 kg/m.    ECOG FS: 0 Filed Weights   12/20/19 0843  Weight: 233 lb 1.6 oz (105.7 kg)    Sclerae unicteric, EOMs intact Wearing a mask No cervical or supraclavicular adenopathy Lungs no rales or rhonchi Heart regular rate and rhythm Abd soft, nontender, positive bowel sounds MSK no focal spinal tenderness, no upper extremity lymphedema Neuro: nonfocal, well oriented, appropriate affect Breasts: The right breast is benign.  The left breast has undergone lumpectomy and radiation.  There is no evidence of disease recurrence.  Both axillae are benign.   LAB RESULTS: Lab Results  Component Value Date   WBC 6.4 12/20/2019   NEUTROABS 3.5 12/20/2019   HGB 13.2 12/20/2019   HCT 39.7 12/20/2019   MCV 89.2 12/20/2019   PLT 308 12/20/2019      Chemistry      Component Value Date/Time   NA 141 12/17/2018 0848   NA 140 12/02/2016 1303  K 4.6 12/17/2018 0848   K 3.6 12/02/2016 1303   CL 104 12/17/2018 0848   CL 103 05/25/2012 1457   CO2 28 12/17/2018 0848   CO2 26 12/02/2016 1303   BUN 20 12/17/2018 0848   BUN 14.8 12/02/2016 1303   CREATININE 0.80 12/17/2018 0848   CREATININE 0.8 12/02/2016 1303      Component Value Date/Time   CALCIUM 9.2 12/17/2018 0848   CALCIUM 8.8 12/02/2016 1303   ALKPHOS 65 12/17/2018 0848   ALKPHOS 60 12/02/2016 1303   AST 14 (L) 12/17/2018 0848   AST 13 12/02/2016 1303   ALT 15 12/17/2018 0848   ALT 14 12/02/2016 1303   BILITOT 0.4 12/17/2018 0848   BILITOT 0.37 12/02/2016 1303       STUDIES: MM 3D SCREEN BREAST BILATERAL  Result Date: 12/03/2019 CLINICAL DATA:  Screening. History of left lumpectomy 2010. EXAM: DIGITAL SCREENING BILATERAL MAMMOGRAM WITH TOMO AND CAD COMPARISON:  Previous exam(s). ACR Breast Density Category b: There are scattered areas of fibroglandular density. FINDINGS: There are no findings suspicious for malignancy. Images were processed with CAD. IMPRESSION: No mammographic evidence of  malignancy. A result letter of this screening mammogram will be mailed directly to the patient. RECOMMENDATION: Screening mammogram in one year. (Code:SM-B-01Y) BI-RADS CATEGORY  1: Negative. Electronically Signed   By: Everlean Alstrom M.D.   On: 12/03/2019 08:39    ASSESSMENT: 44 y.o.  BRCA-1 positive [place] woman    (1) status post post left lumpectomy and sentinel lymph node dissection November of 2010 for a T2 N1(mic), stage IB invasive ductal carcinoma, triple negative, with an MIB-1 of 78%, treated adjuvantly with carboplatin and paclitaxel x3 followed by dose dense doxorubicin and cyclophosphamide x4 followed by radiation completed August of 2011   (2) papillary thyroid carcinoma post total thyroidectomy in January of 2011 with 5 of 6 lymph nodes involved, status post radioiodine ablation, with post treatment hypothyroidism, followed through doctor Kerr's office   (3) status post removal of a nevoid melanoma, focally superficial level IV, Breslow depth 0.46 mm, from the mid to upper back, with margins initially involved 03/13/1998, status post excision of multiple dysplastic nevi since that time, followed by Dr. Tonia Brooms  (4) s/p BSO November 2011 with benign pathology  (5) BRCA1 carries a deleterious mutation ([id number]); there was no mutation in the BRCA2 gene (testing 11/04/2008)   PLAN:  Corrinne is now 11 years out from definitive surgery for her breast cancer with no evidence of disease recurrence.  This is very favorable.  She remains at high risk of developing another breast cancer.  She just had a good mammogram.  She will have a breast MRI in May 2022.  She will then have her next mammogram November 2022 and I will see her a year from now  I have encouraged to exercise 42mnutes a day 5 days a week.  She can break it up into 20-minute segments.  Another way to do it would be to go 7500 steps a day.  She does have a step counter.  Another way to do it is to walk 3 miles at a  time.  Anyway she wants to documented 5 times a week is the goal  She knows to call for any other issue admittable before the next visit  Total encounter time 25 minutes.*  Latavious Bitter, GVirgie Dad MD  12/20/19 8:56 AM Medical Oncology and Hematology [facility] [address]Tel. [phone]  Fax. [phone]    I, Wilburn Mylar, am acting as scribe for Dr. Virgie Dad. Katiejo Gilroy.  I, Lurline Del MD, have reviewed the above documentation for accuracy and completeness, and I agree with the above.   *Total Encounter Time as defined by the Centers for Medicare and Medicaid Services includes, in addition to the face-to-face time of a patient visit (documented in the note above) non-face-to-face time: obtaining and reviewing outside history, ordering and reviewing medications, tests or procedures, care coordination (communications with other health care professionals or caregivers) and documentation in the medical record.

## 2019-12-20 ENCOUNTER — Other Ambulatory Visit: Payer: Self-pay

## 2019-12-20 ENCOUNTER — Inpatient Hospital Stay: Payer: BC Managed Care – PPO | Admitting: Oncology

## 2019-12-20 ENCOUNTER — Inpatient Hospital Stay: Payer: BC Managed Care – PPO | Attending: Oncology

## 2019-12-20 VITALS — BP 126/68 | HR 96 | Temp 98.3°F | Resp 17 | Ht 66.0 in | Wt 233.1 lb

## 2019-12-20 DIAGNOSIS — Z23 Encounter for immunization: Secondary | ICD-10-CM | POA: Diagnosis not present

## 2019-12-20 DIAGNOSIS — Z1501 Genetic susceptibility to malignant neoplasm of breast: Secondary | ICD-10-CM

## 2019-12-20 DIAGNOSIS — C4359 Malignant melanoma of other part of trunk: Secondary | ICD-10-CM

## 2019-12-20 DIAGNOSIS — C50412 Malignant neoplasm of upper-outer quadrant of left female breast: Secondary | ICD-10-CM

## 2019-12-20 DIAGNOSIS — Z171 Estrogen receptor negative status [ER-]: Secondary | ICD-10-CM | POA: Diagnosis not present

## 2019-12-20 DIAGNOSIS — Z79899 Other long term (current) drug therapy: Secondary | ICD-10-CM | POA: Diagnosis not present

## 2019-12-20 DIAGNOSIS — Z9221 Personal history of antineoplastic chemotherapy: Secondary | ICD-10-CM | POA: Insufficient documentation

## 2019-12-20 DIAGNOSIS — Z90722 Acquired absence of ovaries, bilateral: Secondary | ICD-10-CM | POA: Diagnosis not present

## 2019-12-20 DIAGNOSIS — Z85850 Personal history of malignant neoplasm of thyroid: Secondary | ICD-10-CM | POA: Diagnosis not present

## 2019-12-20 DIAGNOSIS — C73 Malignant neoplasm of thyroid gland: Secondary | ICD-10-CM

## 2019-12-20 DIAGNOSIS — Z803 Family history of malignant neoplasm of breast: Secondary | ICD-10-CM | POA: Insufficient documentation

## 2019-12-20 DIAGNOSIS — Z8041 Family history of malignant neoplasm of ovary: Secondary | ICD-10-CM | POA: Diagnosis not present

## 2019-12-20 DIAGNOSIS — Z923 Personal history of irradiation: Secondary | ICD-10-CM | POA: Insufficient documentation

## 2019-12-20 DIAGNOSIS — Z853 Personal history of malignant neoplasm of breast: Secondary | ICD-10-CM | POA: Insufficient documentation

## 2019-12-20 DIAGNOSIS — Z85820 Personal history of malignant melanoma of skin: Secondary | ICD-10-CM | POA: Diagnosis not present

## 2019-12-20 DIAGNOSIS — C50911 Malignant neoplasm of unspecified site of right female breast: Secondary | ICD-10-CM | POA: Diagnosis not present

## 2019-12-20 LAB — CMP (CANCER CENTER ONLY)
ALT: 19 U/L (ref 0–44)
AST: 16 U/L (ref 15–41)
Albumin: 3.7 g/dL (ref 3.5–5.0)
Alkaline Phosphatase: 66 U/L (ref 38–126)
Anion gap: 6 (ref 5–15)
BUN: 14 mg/dL (ref 6–20)
CO2: 26 mmol/L (ref 22–32)
Calcium: 9.1 mg/dL (ref 8.9–10.3)
Chloride: 107 mmol/L (ref 98–111)
Creatinine: 0.8 mg/dL (ref 0.44–1.00)
GFR, Estimated: >60 mL/min (ref >60)
Glucose, Bld: 98 mg/dL (ref 70–99)
Potassium: 4.3 mmol/L (ref 3.5–5.1)
Sodium: 139 mmol/L (ref 135–145)
Total Bilirubin: 0.5 mg/dL (ref 0.3–1.2)
Total Protein: 7 g/dL (ref 6.5–8.1)

## 2019-12-20 LAB — CBC WITH DIFFERENTIAL (CANCER CENTER ONLY)
Abs Immature Granulocytes: 0.02 10*3/uL (ref 0.00–0.07)
Basophils Absolute: 0.1 10*3/uL (ref 0.0–0.1)
Basophils Relative: 1 %
Eosinophils Absolute: 0.2 10*3/uL (ref 0.0–0.5)
Eosinophils Relative: 3 %
HCT: 39.7 % (ref 36.0–46.0)
Hemoglobin: 13.2 g/dL (ref 12.0–15.0)
Immature Granulocytes: 0 %
Lymphocytes Relative: 34 %
Lymphs Abs: 2.2 10*3/uL (ref 0.7–4.0)
MCH: 29.7 pg (ref 26.0–34.0)
MCHC: 33.2 g/dL (ref 30.0–36.0)
MCV: 89.2 fL (ref 80.0–100.0)
Monocytes Absolute: 0.5 10*3/uL (ref 0.1–1.0)
Monocytes Relative: 8 %
Neutro Abs: 3.5 10*3/uL (ref 1.7–7.7)
Neutrophils Relative %: 54 %
Platelet Count: 308 10*3/uL (ref 150–400)
RBC: 4.45 MIL/uL (ref 3.87–5.11)
RDW: 12.9 % (ref 11.5–15.5)
WBC Count: 6.4 10*3/uL (ref 4.0–10.5)
nRBC: 0 % (ref 0.0–0.2)

## 2019-12-20 MED ORDER — INFLUENZA VAC SPLIT QUAD 0.5 ML IM SUSY
PREFILLED_SYRINGE | INTRAMUSCULAR | Status: AC
  Filled 2019-12-20: qty 0.5

## 2019-12-20 MED ORDER — INFLUENZA VAC SPLIT QUAD 0.5 ML IM SUSY
0.5000 mL | PREFILLED_SYRINGE | Freq: Once | INTRAMUSCULAR | Status: AC
  Administered 2019-12-20: 0.5 mL via INTRAMUSCULAR

## 2019-12-28 ENCOUNTER — Other Ambulatory Visit: Payer: Self-pay | Admitting: Adult Health

## 2019-12-28 DIAGNOSIS — C73 Malignant neoplasm of thyroid gland: Secondary | ICD-10-CM

## 2019-12-28 DIAGNOSIS — Z1501 Genetic susceptibility to malignant neoplasm of breast: Secondary | ICD-10-CM

## 2019-12-28 DIAGNOSIS — C50412 Malignant neoplasm of upper-outer quadrant of left female breast: Secondary | ICD-10-CM

## 2019-12-28 DIAGNOSIS — Z1509 Genetic susceptibility to other malignant neoplasm: Secondary | ICD-10-CM

## 2020-02-17 DIAGNOSIS — Z85820 Personal history of malignant melanoma of skin: Secondary | ICD-10-CM | POA: Diagnosis not present

## 2020-02-17 DIAGNOSIS — L814 Other melanin hyperpigmentation: Secondary | ICD-10-CM | POA: Diagnosis not present

## 2020-02-17 DIAGNOSIS — D225 Melanocytic nevi of trunk: Secondary | ICD-10-CM | POA: Diagnosis not present

## 2020-02-17 DIAGNOSIS — Z86018 Personal history of other benign neoplasm: Secondary | ICD-10-CM | POA: Diagnosis not present

## 2020-04-25 DIAGNOSIS — Z13220 Encounter for screening for lipoid disorders: Secondary | ICD-10-CM | POA: Diagnosis not present

## 2020-04-25 DIAGNOSIS — Z85850 Personal history of malignant neoplasm of thyroid: Secondary | ICD-10-CM | POA: Diagnosis not present

## 2020-04-25 DIAGNOSIS — E890 Postprocedural hypothyroidism: Secondary | ICD-10-CM | POA: Diagnosis not present

## 2020-04-25 DIAGNOSIS — Z5181 Encounter for therapeutic drug level monitoring: Secondary | ICD-10-CM | POA: Diagnosis not present

## 2020-04-27 DIAGNOSIS — Z0000 Encounter for general adult medical examination without abnormal findings: Secondary | ICD-10-CM | POA: Diagnosis not present

## 2020-04-27 DIAGNOSIS — Z23 Encounter for immunization: Secondary | ICD-10-CM | POA: Diagnosis not present

## 2020-05-02 ENCOUNTER — Ambulatory Visit
Admission: RE | Admit: 2020-05-02 | Discharge: 2020-05-02 | Disposition: A | Discharge status: Home / Self Care | Payer: BC Managed Care – PPO | Source: Ambulatory Visit | Attending: Oncology | Admitting: Oncology

## 2020-05-02 DIAGNOSIS — C50412 Malignant neoplasm of upper-outer quadrant of left female breast: Secondary | ICD-10-CM

## 2020-05-02 DIAGNOSIS — Z1501 Genetic susceptibility to malignant neoplasm of breast: Secondary | ICD-10-CM

## 2020-05-02 DIAGNOSIS — C4359 Malignant melanoma of other part of trunk: Secondary | ICD-10-CM

## 2020-05-02 DIAGNOSIS — C73 Malignant neoplasm of thyroid gland: Secondary | ICD-10-CM

## 2020-05-02 DIAGNOSIS — Z853 Personal history of malignant neoplasm of breast: Secondary | ICD-10-CM | POA: Diagnosis not present

## 2020-05-02 DIAGNOSIS — C50911 Malignant neoplasm of unspecified site of right female breast: Secondary | ICD-10-CM

## 2020-05-02 DIAGNOSIS — Z85820 Personal history of malignant melanoma of skin: Secondary | ICD-10-CM | POA: Diagnosis not present

## 2020-05-02 MED ORDER — GADOBUTROL 1 MMOL/ML IV SOLN
10.0000 mL | Freq: Once | INTRAVENOUS | Status: AC | PRN
  Administered 2020-05-02: 10 mL via INTRAVENOUS

## 2020-05-16 DIAGNOSIS — F321 Major depressive disorder, single episode, moderate: Secondary | ICD-10-CM | POA: Diagnosis not present

## 2020-06-07 ENCOUNTER — Other Ambulatory Visit: Payer: Self-pay | Admitting: Oncology

## 2020-06-08 ENCOUNTER — Telehealth: Payer: Self-pay | Admitting: Oncology

## 2020-06-08 NOTE — Telephone Encounter (Signed)
R/s appt time per 6/1 sch msg. Mailed updated calendar to pt.

## 2020-07-27 DIAGNOSIS — M67371 Transient synovitis, right ankle and foot: Secondary | ICD-10-CM | POA: Diagnosis not present

## 2020-07-27 DIAGNOSIS — M19071 Primary osteoarthritis, right ankle and foot: Secondary | ICD-10-CM | POA: Diagnosis not present

## 2020-07-27 DIAGNOSIS — M109 Gout, unspecified: Secondary | ICD-10-CM | POA: Diagnosis not present

## 2020-08-01 DIAGNOSIS — M109 Gout, unspecified: Secondary | ICD-10-CM | POA: Diagnosis not present

## 2020-08-01 DIAGNOSIS — M7751 Other enthesopathy of right foot: Secondary | ICD-10-CM | POA: Diagnosis not present

## 2020-08-29 DIAGNOSIS — R509 Fever, unspecified: Secondary | ICD-10-CM | POA: Diagnosis not present

## 2020-08-29 DIAGNOSIS — U071 COVID-19: Secondary | ICD-10-CM | POA: Diagnosis not present

## 2020-08-29 DIAGNOSIS — R52 Pain, unspecified: Secondary | ICD-10-CM | POA: Diagnosis not present

## 2020-08-29 DIAGNOSIS — R051 Acute cough: Secondary | ICD-10-CM | POA: Diagnosis not present

## 2020-10-05 DIAGNOSIS — Z6836 Body mass index (BMI) 36.0-36.9, adult: Secondary | ICD-10-CM | POA: Diagnosis not present

## 2020-10-05 DIAGNOSIS — Z01419 Encounter for gynecological examination (general) (routine) without abnormal findings: Secondary | ICD-10-CM | POA: Diagnosis not present

## 2020-10-17 DIAGNOSIS — Z23 Encounter for immunization: Secondary | ICD-10-CM | POA: Diagnosis not present

## 2020-10-23 ENCOUNTER — Other Ambulatory Visit: Payer: Self-pay | Admitting: Oncology

## 2020-10-23 DIAGNOSIS — Z1231 Encounter for screening mammogram for malignant neoplasm of breast: Secondary | ICD-10-CM

## 2020-10-26 DIAGNOSIS — E890 Postprocedural hypothyroidism: Secondary | ICD-10-CM | POA: Diagnosis not present

## 2020-11-09 DIAGNOSIS — E890 Postprocedural hypothyroidism: Secondary | ICD-10-CM | POA: Diagnosis not present

## 2020-11-09 DIAGNOSIS — Z85850 Personal history of malignant neoplasm of thyroid: Secondary | ICD-10-CM | POA: Diagnosis not present

## 2020-12-06 ENCOUNTER — Ambulatory Visit
Admission: RE | Admit: 2020-12-06 | Discharge: 2020-12-06 | Disposition: A | Discharge status: Home / Self Care | Payer: BC Managed Care – PPO | Source: Ambulatory Visit | Attending: Oncology | Admitting: Oncology

## 2020-12-06 DIAGNOSIS — Z1231 Encounter for screening mammogram for malignant neoplasm of breast: Secondary | ICD-10-CM

## 2020-12-12 DIAGNOSIS — H1045 Other chronic allergic conjunctivitis: Secondary | ICD-10-CM | POA: Diagnosis not present

## 2020-12-15 DIAGNOSIS — M79605 Pain in left leg: Secondary | ICD-10-CM | POA: Diagnosis not present

## 2020-12-17 NOTE — Progress Notes (Signed)
ID: Audrey Chambers   DOB: Jan 02, 1976  MR#: [id number]  IDP#:[id number]  Patient Care Team: Darcus Austin, MD (Inactive) as PCP - General (Family Medicine) Magrinat, Audrey Dad, MD as Consulting Physician (Oncology) Louretta Shorten, MD as Consulting Physician (Obstetrics and Gynecology) OTHER MD: Audrey Chambers, Audrey Chambers  CHIEF COMPLAINT: BRCA positive, estrogen receptor negative breast cancer  CURRENT TREATMENT: Observation   INTERVAL HISTORY: Audrey Chambers returns today for follow-up of her estrogen receptor negative, BRCA1 associated breast cancer. She continues under observation.   Since her last visit, she underwent breast MRI on 05/02/2020 showing: breast composition B; no evidence of new or recurrent breast carcinoma. She gets breast MRI every other year.  She also underwent bilateral screening mammography with tomography at [place] on 12/06/2020 showing: breast density category B; no evidence of malignancy in either breast.    REVIEW OF SYSTEMS: Audrey Chambers tells me her worst problem is anxiety.  She worries whether she should have had mastectomies.  She just lost a cousin at age 41 (that cousin's mother died at age 90 from ovarian cancer.  There have been some billing issues related to the MRI and mammography as well.  Otherwise a detailed review of systems today was noncontributory  COVID 19 VACCINATION STATUS: Refuses vaccinations for SAR S Covid 2; head COVID August 2022, 2 Paxlobid briefly   HISTORY OF PRESENT ILLNESS: From the original intake note:  In mid October of 2010 Audrey Chambers palpated a mass in her left breast. She brought this to Dr. Roque Chambers attention and he set her up for bilateral mammography October of 2010.  Dr. Glennon Chambers was able to palpate a mobile nodule in the left breast at the 12:00 position which by mammography showed up as subtle questionable asymmetry. By ultrasound there was an irregular hypoechoic mass measuring 1.7 cm. Biopsy was performed the same day and  showed ([id number]) an invasive mammary carcinoma which was estrogen and progesterone receptor negative, with an MIB-1 of 78% and no HER-2 amplification, the ratio being 0.91.   With this information the patient was referred to Dr. Dalbert Chambers and bilateral breast MRIs were obtained November 03, 2008. This showed a solitary mass in the left breast measuring 2.2 cm with no evidence of internal mammary or axillary lymph node positivity. At this point the patient was set up for genetic studies. She proved to be BRCA-1 positive. Scans showed no evidence of metastatic disease otherwise a right thyroid nodule noted at that time.   With this information the patient proceeded to left partial mastectomy with axillary lymph node sampling November 2010. The final pathology (S. [id number] [id number]) showed a 2.1 cm invasive ductal carcinoma with negative and ample margins but with evidence of lymphovascular invasion. One out of 3 sentinel lymph nodes was involved by micrometastatic deposit. The tumor was grade 3   Her subsequent history is as detailed below   PAST MEDICAL HISTORY: Past Medical History:  Diagnosis Date   Breast CA ([place])    breast ca dx 11/10   Melanoma of back ([place]) 2002   resolved   Personal history of chemotherapy    Personal history of radiation therapy   Significant for melanoma removal approximately 10 years ago from the upper back.  Dr. Crista Chambers did the original excision and Dr. Marylene Chambers did the deeper surgery.  The patient tells me this was "superficial" and certainly being more than 10 years ago, this is almost certainly cured.  Polycystic ovaries. Status post exploratory laparotomy for what sounds like  it may have been mild endometriosis.     PAST SURGICAL HISTORY: Past Surgical History:  Procedure Laterality Date   BREAST LUMPECTOMY Left 2010   EXPLORATORY LAPAROTOMY  remote   for possible endometriosis   THYROIDECTOMY  January 2011   for papillary thyroid CA    FAMILY  HISTORY Family History  Problem Relation Age of Onset   Breast cancer Mother 91       survived   Ovarian cancer Sister 59       died  The patient's father was murdered in his home at age 29.  Her mother had breast cancer diagnosed in her 42.  She is 46 years old as of December 2020 the patient's mother's only sister only sister had ovarian cancer diagnosed in her 27s and died from that disease. The patient has one brother and one sister, both in good health. The patient's sister has been tested for the BRCA mutation and was found to be positive. She has undergone bilateral salpingo-oophorectomy and according to the patient is being appropriately screened.   GYNECOLOGIC HISTORY: The patient is Audrey Chambers P0; she never took oral contraceptives; s/p BSO November 2011 with benign pathology   SOCIAL HISTORY: (updated December 2021 She worked part time at [employer] in [employer], [employer].  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 Audrey Chambers also works at [employer]. They have no children.  The patient is not a church attender.     ADVANCED DIRECTIVES: In the absence of any documents to the contrary the patient's husband is her healthcare power of attorney   HEALTH MAINTENANCE: Social History   Tobacco Use   Smoking status: Never   Smokeless tobacco: Never  Substance Use Topics   Alcohol use: No   Drug use: No     Colonoscopy:  PAP: UTD/Mezer  Bone density:  Lipid panel:  Allergies  Allergen Reactions   Neomycin-Bacitracin Zn-Polymyx Rash    Current Outpatient Medications  Medication Sig Dispense Refill   Calcium Carbonate-Vitamin D (CALCIUM 600 + D PO) Take 1 tablet by mouth daily.       gabapentin (NEURONTIN) 100 MG capsule TAKE 1 CAPSULE(100 MG) BY MOUTH AT BEDTIME 90 capsule 3   SYNTHROID 175 MCG tablet TK 1 T PO QAM ON AN EMPTY STOMACH  12    No current facility-administered medications for this visit.    OBJECTIVE: White woman who appears well  Vitals:   12/18/20 1056  BP: 129/84  Pulse: 99  Temp: 97.6 F (36.4 C)  SpO2: 100%      Body mass index is 36.36 kg/m.    ECOG FS: 0 Filed Weights   12/18/20 1056  Weight: 225 lb 4.8 oz (102.2 kg)    Sclerae unicteric, EOMs intact Wearing a mask No cervical or supraclavicular adenopathy Lungs no rales or rhonchi Heart regular rate and rhythm Abd soft, nontender, positive bowel sounds MSK no focal spinal tenderness, no upper extremity lymphedema Neuro: nonfocal, well oriented, appropriate affect Breasts: The right breast is unremarkable.  The left breast is status postlumpectomy and radiation with good cosmetic result.  There is no evidence of disease recurrence.  Both axilla are benign.   LAB RESULTS: Lab Results  Component Value Date   WBC 6.4 12/18/2020   NEUTROABS 4.0 12/18/2020   HGB 13.8 12/18/2020   HCT 39.7 12/18/2020   MCV 87.3 12/18/2020  PLT 331 12/18/2020      Chemistry      Component Value Date/Time   NA 139 12/20/2019 0829   NA 140 12/02/2016 1303   K 4.3 12/20/2019 0829   K 3.6 12/02/2016 1303   CL 107 12/20/2019 0829   CL 103 05/25/2012 1457   CO2 26 12/20/2019 0829   CO2 26 12/02/2016 1303   BUN 14 12/20/2019 0829   BUN 14.8 12/02/2016 1303   CREATININE 0.80 12/20/2019 0829   CREATININE 0.8 12/02/2016 1303      Component Value Date/Time   CALCIUM 9.1 12/20/2019 0829   CALCIUM 8.8 12/02/2016 1303   ALKPHOS 66 12/20/2019 0829   ALKPHOS 60 12/02/2016 1303   AST 16 12/20/2019 0829   AST 13 12/02/2016 1303   ALT 19 12/20/2019 0829   ALT 14 12/02/2016 1303   BILITOT 0.5 12/20/2019 0829   BILITOT 0.37 12/02/2016 1303      STUDIES: MM 3D SCREEN BREAST BILATERAL  Result Date: 12/06/2020 CLINICAL DATA:  Screening. EXAM: DIGITAL SCREENING BILATERAL MAMMOGRAM WITH TOMOSYNTHESIS AND CAD TECHNIQUE: Bilateral screening digital  craniocaudal and mediolateral oblique mammograms were obtained. Bilateral screening digital breast tomosynthesis was performed. The images were evaluated with computer-aided detection. COMPARISON:  Previous exam(s). ACR Breast Density Category b: There are scattered areas of fibroglandular density. FINDINGS: There are no findings suspicious for malignancy. IMPRESSION: No mammographic evidence of malignancy. A result letter of this screening mammogram will be mailed directly to the patient. RECOMMENDATION: Screening mammogram in one year. (Code:SM-B-01Y) BI-RADS CATEGORY  1: Negative. Electronically Signed   By: Kristopher Oppenheim M.D.   On: 12/06/2020 10:58     ASSESSMENT: 45 y.o.  BRCA-1 positive [place] woman    (1) status post post left lumpectomy and sentinel lymph node dissection November of 2010 for a T2 N1(mic), stage IB invasive ductal carcinoma, triple negative, with an MIB-1 of 78%, treated adjuvantly with carboplatin and paclitaxel x3 followed by dose dense doxorubicin and cyclophosphamide x4 followed by radiation completed August of 2011   (2) papillary thyroid carcinoma post total thyroidectomy in January of 2011 with 5 of 6 lymph nodes involved, status post radioiodine ablation, with post treatment hypothyroidism, followed through doctor Kerr's office   (3) status post removal of a nevoid melanoma, focally superficial level IV, Breslow depth 0.46 mm, from the mid to upper back, with margins initially involved 03/13/1998, status post excision of multiple dysplastic nevi since that time, followed by Dr. Tonia Brooms  (4) s/p BSO November 2011 with benign pathology  (5) BRCA1 carries a deleterious mutation ([id number]); there was no mutation in the BRCA2 gene (testing 11/04/2008)  (6) intensified screening:  (A) mammography April or May yearly  (B) breast MRI October or November yearly   PLAN:  Presley is now 12 years out from definitive surgery for her breast cancer with no evidence of  disease recurrence.  This is very favorable.  She continues on intensified screening, with an MRI in April and mammography in October or November.  The fact that her breast density is B is also helpful.  I reassured her that there is absolutely no survival advantage to mastectomy as compared to intensified screening and she is welcome to keep her breast which is her preference since she is concerned about the possible complications not only of the mastectomies but more importantly of the reconstruction and that she would want to undergo  She will see [name] again in 1 year.  She knows to call for any  other issue that may develop before the next visit  Total encounter time 25 minutes.*  Magrinat, Audrey Dad, MD  12/18/20 11:05 AM Medical Oncology and Hematology [facility] [place], [place] [address] Tel. [phone]    Fax. [phone]    I, Wilburn Mylar, am acting as scribe for Dr. Virgie Chambers. Magrinat.  I, Lurline Del MD, have reviewed the above documentation for accuracy and completeness, and I agree with the above.   *Total Encounter Time as defined by the Centers for Medicare and Medicaid Services includes, in addition to the face-to-face time of a patient visit (documented in the note above) non-face-to-face time: obtaining and reviewing outside history, ordering and reviewing medications, tests or procedures, care coordination (communications with other health care professionals or caregivers) and documentation in the medical record.

## 2020-12-18 ENCOUNTER — Inpatient Hospital Stay: Payer: BC Managed Care – PPO | Attending: Oncology

## 2020-12-18 ENCOUNTER — Other Ambulatory Visit: Payer: Self-pay

## 2020-12-18 ENCOUNTER — Other Ambulatory Visit: Payer: BC Managed Care – PPO

## 2020-12-18 ENCOUNTER — Other Ambulatory Visit: Payer: Self-pay | Admitting: *Deleted

## 2020-12-18 ENCOUNTER — Ambulatory Visit: Payer: BC Managed Care – PPO | Admitting: Oncology

## 2020-12-18 ENCOUNTER — Inpatient Hospital Stay: Payer: BC Managed Care – PPO | Admitting: Oncology

## 2020-12-18 VITALS — BP 129/84 | HR 99 | Temp 97.6°F | Wt 225.3 lb

## 2020-12-18 DIAGNOSIS — Z923 Personal history of irradiation: Secondary | ICD-10-CM | POA: Diagnosis not present

## 2020-12-18 DIAGNOSIS — Z9221 Personal history of antineoplastic chemotherapy: Secondary | ICD-10-CM | POA: Diagnosis not present

## 2020-12-18 DIAGNOSIS — C50911 Malignant neoplasm of unspecified site of right female breast: Secondary | ICD-10-CM

## 2020-12-18 DIAGNOSIS — C4359 Malignant melanoma of other part of trunk: Secondary | ICD-10-CM

## 2020-12-18 DIAGNOSIS — Z853 Personal history of malignant neoplasm of breast: Secondary | ICD-10-CM | POA: Insufficient documentation

## 2020-12-18 DIAGNOSIS — C73 Malignant neoplasm of thyroid gland: Secondary | ICD-10-CM

## 2020-12-18 DIAGNOSIS — Z85850 Personal history of malignant neoplasm of thyroid: Secondary | ICD-10-CM | POA: Insufficient documentation

## 2020-12-18 DIAGNOSIS — C50412 Malignant neoplasm of upper-outer quadrant of left female breast: Secondary | ICD-10-CM | POA: Diagnosis not present

## 2020-12-18 DIAGNOSIS — Z171 Estrogen receptor negative status [ER-]: Secondary | ICD-10-CM

## 2020-12-18 DIAGNOSIS — Z1501 Genetic susceptibility to malignant neoplasm of breast: Secondary | ICD-10-CM

## 2020-12-18 LAB — CMP (CANCER CENTER ONLY)
ALT: 15 U/L (ref 0–44)
AST: 12 U/L — ABNORMAL LOW (ref 15–41)
Albumin: 3.8 g/dL (ref 3.5–5.0)
Alkaline Phosphatase: 69 U/L (ref 38–126)
Anion gap: 9 (ref 5–15)
BUN: 14 mg/dL (ref 6–20)
CO2: 25 mmol/L (ref 22–32)
Calcium: 8.8 mg/dL — ABNORMAL LOW (ref 8.9–10.3)
Chloride: 105 mmol/L (ref 98–111)
Creatinine: 0.81 mg/dL (ref 0.44–1.00)
GFR, Estimated: >60 mL/min (ref >60)
Glucose, Bld: 94 mg/dL (ref 70–99)
Potassium: 3.9 mmol/L (ref 3.5–5.1)
Sodium: 139 mmol/L (ref 135–145)
Total Bilirubin: 0.4 mg/dL (ref 0.3–1.2)
Total Protein: 6.9 g/dL (ref 6.5–8.1)

## 2020-12-18 LAB — CBC WITH DIFFERENTIAL (CANCER CENTER ONLY)
Abs Immature Granulocytes: 0.02 10*3/uL (ref 0.00–0.07)
Basophils Absolute: 0 10*3/uL (ref 0.0–0.1)
Basophils Relative: 1 %
Eosinophils Absolute: 0.1 10*3/uL (ref 0.0–0.5)
Eosinophils Relative: 2 %
HCT: 39.7 % (ref 36.0–46.0)
Hemoglobin: 13.8 g/dL (ref 12.0–15.0)
Immature Granulocytes: 0 %
Lymphocytes Relative: 29 %
Lymphs Abs: 1.8 10*3/uL (ref 0.7–4.0)
MCH: 30.3 pg (ref 26.0–34.0)
MCHC: 34.8 g/dL (ref 30.0–36.0)
MCV: 87.3 fL (ref 80.0–100.0)
Monocytes Absolute: 0.4 10*3/uL (ref 0.1–1.0)
Monocytes Relative: 6 %
Neutro Abs: 4 10*3/uL (ref 1.7–7.7)
Neutrophils Relative %: 62 %
Platelet Count: 331 10*3/uL (ref 150–400)
RBC: 4.55 MIL/uL (ref 3.87–5.11)
RDW: 12.7 % (ref 11.5–15.5)
WBC Count: 6.4 10*3/uL (ref 4.0–10.5)
nRBC: 0 % (ref 0.0–0.2)

## 2020-12-21 ENCOUNTER — Telehealth: Payer: Self-pay | Admitting: Oncology

## 2020-12-21 NOTE — Telephone Encounter (Signed)
Patient wanted to find out about follow-up appointment. Patient is aware.

## 2021-02-02 DIAGNOSIS — D123 Benign neoplasm of transverse colon: Secondary | ICD-10-CM | POA: Diagnosis not present

## 2021-02-02 DIAGNOSIS — K5730 Diverticulosis of large intestine without perforation or abscess without bleeding: Secondary | ICD-10-CM | POA: Diagnosis not present

## 2021-02-02 DIAGNOSIS — Z1211 Encounter for screening for malignant neoplasm of colon: Secondary | ICD-10-CM | POA: Diagnosis not present

## 2021-02-02 DIAGNOSIS — D120 Benign neoplasm of cecum: Secondary | ICD-10-CM | POA: Diagnosis not present

## 2021-02-02 DIAGNOSIS — D122 Benign neoplasm of ascending colon: Secondary | ICD-10-CM | POA: Diagnosis not present

## 2021-02-02 DIAGNOSIS — D125 Benign neoplasm of sigmoid colon: Secondary | ICD-10-CM | POA: Diagnosis not present

## 2021-02-02 DIAGNOSIS — K648 Other hemorrhoids: Secondary | ICD-10-CM | POA: Diagnosis not present

## 2021-02-19 DIAGNOSIS — D225 Melanocytic nevi of trunk: Secondary | ICD-10-CM | POA: Diagnosis not present

## 2021-02-19 DIAGNOSIS — Z85820 Personal history of malignant melanoma of skin: Secondary | ICD-10-CM | POA: Diagnosis not present

## 2021-02-19 DIAGNOSIS — D485 Neoplasm of uncertain behavior of skin: Secondary | ICD-10-CM | POA: Diagnosis not present

## 2021-02-19 DIAGNOSIS — L814 Other melanin hyperpigmentation: Secondary | ICD-10-CM | POA: Diagnosis not present

## 2021-02-19 DIAGNOSIS — Z86018 Personal history of other benign neoplasm: Secondary | ICD-10-CM | POA: Diagnosis not present

## 2021-03-02 ENCOUNTER — Other Ambulatory Visit: Payer: Self-pay | Admitting: Adult Health

## 2021-03-02 DIAGNOSIS — Z1509 Genetic susceptibility to other malignant neoplasm: Secondary | ICD-10-CM

## 2021-03-02 DIAGNOSIS — Z1501 Genetic susceptibility to malignant neoplasm of breast: Secondary | ICD-10-CM

## 2021-03-02 DIAGNOSIS — C73 Malignant neoplasm of thyroid gland: Secondary | ICD-10-CM

## 2021-03-02 DIAGNOSIS — C50412 Malignant neoplasm of upper-outer quadrant of left female breast: Secondary | ICD-10-CM

## 2021-03-26 ENCOUNTER — Other Ambulatory Visit: Payer: Self-pay | Admitting: Hematology and Oncology

## 2021-03-26 DIAGNOSIS — Z171 Estrogen receptor negative status [ER-]: Secondary | ICD-10-CM

## 2021-03-26 DIAGNOSIS — C50911 Malignant neoplasm of unspecified site of right female breast: Secondary | ICD-10-CM

## 2021-03-26 DIAGNOSIS — C4359 Malignant melanoma of other part of trunk: Secondary | ICD-10-CM

## 2021-03-26 DIAGNOSIS — C73 Malignant neoplasm of thyroid gland: Secondary | ICD-10-CM

## 2021-04-20 ENCOUNTER — Other Ambulatory Visit: Payer: BC Managed Care – PPO

## 2021-04-27 ENCOUNTER — Telehealth: Payer: Self-pay

## 2021-04-27 ENCOUNTER — Other Ambulatory Visit: Payer: Self-pay | Admitting: Hematology and Oncology

## 2021-04-27 ENCOUNTER — Ambulatory Visit
Admission: RE | Admit: 2021-04-27 | Discharge: 2021-04-27 | Disposition: A | Discharge status: Home / Self Care | Payer: BC Managed Care – PPO | Source: Ambulatory Visit | Attending: Hematology and Oncology | Admitting: Hematology and Oncology

## 2021-04-27 DIAGNOSIS — R9389 Abnormal findings on diagnostic imaging of other specified body structures: Secondary | ICD-10-CM

## 2021-04-27 DIAGNOSIS — C50911 Malignant neoplasm of unspecified site of right female breast: Secondary | ICD-10-CM

## 2021-04-27 DIAGNOSIS — Z853 Personal history of malignant neoplasm of breast: Secondary | ICD-10-CM | POA: Diagnosis not present

## 2021-04-27 DIAGNOSIS — C4359 Malignant melanoma of other part of trunk: Secondary | ICD-10-CM

## 2021-04-27 DIAGNOSIS — Z171 Estrogen receptor negative status [ER-]: Secondary | ICD-10-CM

## 2021-04-27 DIAGNOSIS — C73 Malignant neoplasm of thyroid gland: Secondary | ICD-10-CM

## 2021-04-27 MED ORDER — GADOBUTROL 1 MMOL/ML IV SOLN
9.0000 mL | Freq: Once | INTRAVENOUS | Status: AC | PRN
  Administered 2021-04-27: 9 mL via INTRAVENOUS

## 2021-04-27 NOTE — Telephone Encounter (Signed)
Pt called and voices concerns regarding her MRI results because she was called to come in for biopsy 5/8. Spoke with Dr Purcell Nails at [facility] and he explained impression #1 on MRI results so we can give pt clarity. Explained to pt what Dr Purcell Nails and I discussed which is that she has no mass, but since she has a h/o BRCA they will do bx ro make sure she does not have ADH or DCIS. Pt understands and verbalized thanks.  ?

## 2021-05-02 ENCOUNTER — Telehealth: Payer: Self-pay | Admitting: Hematology and Oncology

## 2021-05-02 DIAGNOSIS — Z79899 Other long term (current) drug therapy: Secondary | ICD-10-CM | POA: Diagnosis not present

## 2021-05-02 DIAGNOSIS — Z0000 Encounter for general adult medical examination without abnormal findings: Secondary | ICD-10-CM | POA: Diagnosis not present

## 2021-05-02 DIAGNOSIS — Z13220 Encounter for screening for lipoid disorders: Secondary | ICD-10-CM | POA: Diagnosis not present

## 2021-05-02 NOTE — Telephone Encounter (Signed)
Scheduled appointment per provider request. Patient aware.  ?

## 2021-05-14 ENCOUNTER — Ambulatory Visit
Admission: RE | Admit: 2021-05-14 | Discharge: 2021-05-14 | Disposition: A | Discharge status: Home / Self Care | Payer: BC Managed Care – PPO | Source: Ambulatory Visit | Attending: Hematology and Oncology | Admitting: Hematology and Oncology

## 2021-05-14 DIAGNOSIS — R928 Other abnormal and inconclusive findings on diagnostic imaging of breast: Secondary | ICD-10-CM | POA: Diagnosis not present

## 2021-05-14 DIAGNOSIS — R9389 Abnormal findings on diagnostic imaging of other specified body structures: Secondary | ICD-10-CM

## 2021-05-14 DIAGNOSIS — N6489 Other specified disorders of breast: Secondary | ICD-10-CM | POA: Diagnosis not present

## 2021-05-14 MED ORDER — GADOBUTROL 1 MMOL/ML IV SOLN
10.0000 mL | Freq: Once | INTRAVENOUS | Status: AC | PRN
  Administered 2021-05-14: 10 mL via INTRAVENOUS

## 2021-05-18 ENCOUNTER — Other Ambulatory Visit: Payer: Self-pay

## 2021-05-18 ENCOUNTER — Encounter: Payer: Self-pay | Admitting: Hematology and Oncology

## 2021-05-18 ENCOUNTER — Inpatient Hospital Stay: Payer: BC Managed Care – PPO | Attending: Hematology and Oncology | Admitting: Hematology and Oncology

## 2021-05-18 VITALS — BP 139/88 | HR 106 | Temp 98.1°F | Resp 17 | Wt 233.3 lb

## 2021-05-18 DIAGNOSIS — E890 Postprocedural hypothyroidism: Secondary | ICD-10-CM | POA: Insufficient documentation

## 2021-05-18 DIAGNOSIS — Z85850 Personal history of malignant neoplasm of thyroid: Secondary | ICD-10-CM | POA: Insufficient documentation

## 2021-05-18 DIAGNOSIS — C50911 Malignant neoplasm of unspecified site of right female breast: Secondary | ICD-10-CM | POA: Diagnosis not present

## 2021-05-18 DIAGNOSIS — Z90722 Acquired absence of ovaries, bilateral: Secondary | ICD-10-CM | POA: Insufficient documentation

## 2021-05-18 DIAGNOSIS — Z85820 Personal history of malignant melanoma of skin: Secondary | ICD-10-CM | POA: Insufficient documentation

## 2021-05-18 DIAGNOSIS — Z853 Personal history of malignant neoplasm of breast: Secondary | ICD-10-CM | POA: Insufficient documentation

## 2021-05-18 DIAGNOSIS — Z9221 Personal history of antineoplastic chemotherapy: Secondary | ICD-10-CM | POA: Insufficient documentation

## 2021-05-18 DIAGNOSIS — Z1501 Genetic susceptibility to malignant neoplasm of breast: Secondary | ICD-10-CM | POA: Diagnosis not present

## 2021-05-18 DIAGNOSIS — Z79899 Other long term (current) drug therapy: Secondary | ICD-10-CM | POA: Diagnosis not present

## 2021-05-18 DIAGNOSIS — Z923 Personal history of irradiation: Secondary | ICD-10-CM | POA: Insufficient documentation

## 2021-05-18 DIAGNOSIS — Z171 Estrogen receptor negative status [ER-]: Secondary | ICD-10-CM | POA: Diagnosis not present

## 2021-05-18 NOTE — Progress Notes (Signed)
ID: Audrey Chambers   DOB: 12/22/1975  MR#: [id number]  XBL#:[id number] ? ?Patient Care Team: ?Darcus Austin, MD (Inactive) as PCP - General (Family Medicine) ?Magrinat, Virgie Dad, MD (Inactive) as Consulting Physician (Oncology) ?Louretta Shorten, MD as Consulting Physician (Obstetrics and Gynecology) ?OTHER MD: Durenda Guthrie Gruber], [name] ? ?CHIEF COMPLAINT: BRCA positive, estrogen receptor negative breast cancer ? ?CURRENT TREATMENT: Observation ? ? ?INTERVAL HISTORY: ?Audrey Chambers returns today for follow-up of her estrogen receptor negative, BRCA1 associated breast cancer. She continues under observation.  She had an MRI which showed abnormality in the right breast status postbiopsy, fibroadenomatoid changes.  Repeat MRI was recommended in 6 months.  She is not willing to proceed with bilateral mastectomies at this time.  She had bilateral salpingo-oophorectomy.  She denies any changes in her breast.  She continues to examine her breast once a month.  She denies any change in bowel habits, unintentional weight loss, skin lesions.  She follows up with dermatology annually.  She also follows up with Dr. Buddy Duty from endocrinology for her papillary thyroid cancer and continues on levothyroxine.  Rest of the pertinent 10 point ROS reviewed and negative ? ? COVID 19 VACCINATION STATUS: Refuses vaccinations for SAR S Covid 2; head COVID August 2022, 2 Paxlobid briefly ? ? ?HISTORY OF PRESENT ILLNESS: ?From the original intake note: ? ?In mid October of 2010 [name] palpated a mass in her left breast. She brought this to Dr. [name] attention and he set her up for bilateral mammography October of 2010.  Dr. [name] was able to palpate a mobile nodule in the left breast at the 12:00 position which by mammography showed up as subtle questionable asymmetry. By ultrasound there was an irregular hypoechoic mass measuring 1.7 cm. Biopsy was performed the same day and showed ([id number]) an invasive mammary carcinoma which was estrogen and  progesterone receptor negative, with an MIB-1 of 78% and no HER-2 amplification, the ratio being 0.91.  ? ?With this information the patient was referred to Dr. [name] and bilateral breast MRIs were obtained November 03, 2008. This showed a solitary mass in the left breast measuring 2.2 cm with no evidence of internal mammary or axillary lymph node positivity. At this point the patient was set up for genetic studies. She proved to be BRCA-1 positive. Scans showed no evidence of metastatic disease otherwise a right thyroid nodule noted at that time.  ? ?With this information the patient proceeded to left partial mastectomy with axillary lymph node sampling November 2010. The final pathology (S. [id number]) showed a 2.1 cm invasive ductal carcinoma with negative and ample margins but with evidence of lymphovascular invasion. One out of 3 sentinel lymph nodes was involved by micrometastatic deposit. The tumor was grade 3  ? ?Her subsequent history is as detailed below ? ? ?PAST MEDICAL HISTORY: ?Past Medical History:  ?Diagnosis Date  ? Breast CA ([place])   ? breast ca dx 11/10  ? Melanoma of back [facility]) 2002  ? resolved  ? Personal history of chemotherapy   ? Personal history of radiation therapy   ? ?Significant for melanoma removal approximately 10 years ago from the upper back.   ?Dr. [name] did the original excision and Dr. [name] did the deeper surgery.  The patient tells me this was ?superficial? and certainly being more than 10 years ago, this is almost certainly cured.  ?Polycystic ovaries. ?Status post exploratory laparotomy for what sounds like it may have been mild endometriosis.   ? ? ?PAST  SURGICAL HISTORY: ?Past Surgical History:  ?Procedure Laterality Date  ? BREAST LUMPECTOMY Left 2010  ? EXPLORATORY LAPAROTOMY  remote  ? for possible endometriosis  ? THYROIDECTOMY  January 2011  ? for papillary thyroid CA  ? ? ?FAMILY HISTORY ?Family History  ?Problem Relation Age of Onset  ? Breast cancer Mother  73  ?     survived  ? Ovarian cancer Sister 24  ?     died  ?The patient?s father was murdered in his home at age 21.  Her mother had breast cancer diagnosed in her 74.  She is 46 years old as of December 2020 the patient's mother's only sister only sister had ovarian cancer diagnosed in her 32s and died from that disease. The patient has one brother and one sister, both in good health. The patient's sister has been tested for the BRCA mutation and was found to be positive. She has undergone bilateral salpingo-oophorectomy and according to the patient is being appropriately screened. ? ? ?GYNECOLOGIC HISTORY: The patient is [place] P0; she never took oral contraceptives; s/p BSO November 2011 with benign pathology ? ? ?SOCIAL HISTORY: (updated December 2021 ?She worked part time at [employer] in [employer], art.  Her husband, [name], used to work for [employer] in [employer], but now they have opened their own business [employer], working primarily on [name] cars. Doranne also works at [employer]. They have no children.  The patient is not a church attender.   ? ? ADVANCED DIRECTIVES: In the absence of any documents to the contrary the patient's husband is her healthcare power of attorney ? ? ?HEALTH MAINTENANCE: ?Social History  ? ?Tobacco Use  ? Smoking status: Never  ? Smokeless tobacco: Never  ?Substance Use Topics  ? Alcohol use: No  ? Drug use: No  ? ? ? Colonoscopy: ? PAP: UTD/Mezer ? Bone density: ? Lipid panel: ? ?Allergies  ?Allergen Reactions  ? Neomycin-Bacitracin Zn-Polymyx Rash  ? ? ?Current Outpatient Medications  ?Medication Sig Dispense Refill  ? Calcium Carbonate-Vitamin D (CALCIUM 600 + D PO) Take 1 tablet by mouth daily.      ? gabapentin (NEURONTIN) 100 MG capsule TAKE 1 CAPSULE(100 MG) BY MOUTH AT BEDTIME 90 capsule 3  ? SYNTHROID 175 MCG tablet TK 1 T PO QAM ON AN EMPTY STOMACH  12  ? ?No current facility-administered medications for this visit.  ? ? ?OBJECTIVE:  White woman who appears well ? ?Vitals:  ? 05/18/21 1322  ?BP: 139/88  ?Pulse: (!) 106  ?Resp: 17  ?Temp: 98.1 ?F (36.7 ?C)  ?SpO2: 100%  ? ?   Body mass index is 37.66 kg/m?Marland Kitchen    ECOG FS: 0 ?Filed Weights  ? 05/18/21 1322  ?Weight: 233 lb 4.8 oz (105.8 kg)  ? ? ?Physical Exam ?Constitutional:   ?   Appearance: Normal appearance.  ?Cardiovascular:  ?   Rate and Rhythm: Normal rate and regular rhythm.  ?Chest:  ?   Comments: Bilateral breast examined.  Left breast status postlumpectomy.  No palpable masses no regional adenopathy.  Right breast with Steri-Strips in place.  No palpable masses again and no regional adenopathy ?Musculoskeletal:     ?   General: No swelling. Normal range of motion.  ?   Cervical back: Normal range of motion and neck supple. No rigidity.  ?Lymphadenopathy:  ?   Cervical: No cervical adenopathy.  ?Neurological:  ?   Mental Status: She is alert.  ? ? ? ? ?  LAB RESULTS: ?Lab Results  ?Component Value Date  ? WBC 6.4 12/18/2020  ? NEUTROABS 4.0 12/18/2020  ? HGB 13.8 12/18/2020  ? HCT 39.7 12/18/2020  ? MCV 87.3 12/18/2020  ? PLT 331 12/18/2020  ? ? ?  Chemistry   ?   ?Component Value Date/Time  ? NA 139 12/18/2020 1044  ? NA 140 12/02/2016 1303  ? K 3.9 12/18/2020 1044  ? K 3.6 12/02/2016 1303  ? CL 105 12/18/2020 1044  ? CL 103 05/25/2012 1457  ? CO2 25 12/18/2020 1044  ? CO2 26 12/02/2016 1303  ? BUN 14 12/18/2020 1044  ? BUN 14.8 12/02/2016 1303  ? CREATININE 0.81 12/18/2020 1044  ? CREATININE 0.8 12/02/2016 1303  ?    ?Component Value Date/Time  ? CALCIUM 8.8 (L) 12/18/2020 1044  ? CALCIUM 8.8 12/02/2016 1303  ? ALKPHOS 69 12/18/2020 1044  ? ALKPHOS 60 12/02/2016 1303  ? AST 12 (L) 12/18/2020 1044  ? AST 13 12/02/2016 1303  ? ALT 15 12/18/2020 1044  ? ALT 14 12/02/2016 1303  ? BILITOT 0.4 12/18/2020 1044  ? BILITOT 0.37 12/02/2016 1303  ?  ? ? ?STUDIES: ?MR BREAST BILATERAL W WO CONTRAST INC CAD ? ?Result Date: 04/27/2021 ?CLINICAL DATA:  46 year old with a BRCA1 gene mutation and personal  history of malignant lumpectomy of the LEFT breast in 2010 (triple negative), presenting for annual surveillance breast MRI. She also has a personal history of papillary thyroid cancer for which she un

## 2021-05-24 ENCOUNTER — Other Ambulatory Visit: Payer: Self-pay | Admitting: Hematology and Oncology

## 2021-10-12 DIAGNOSIS — Z01419 Encounter for gynecological examination (general) (routine) without abnormal findings: Secondary | ICD-10-CM | POA: Diagnosis not present

## 2021-10-12 DIAGNOSIS — Z6837 Body mass index (BMI) 37.0-37.9, adult: Secondary | ICD-10-CM | POA: Diagnosis not present

## 2021-11-01 DIAGNOSIS — R7309 Other abnormal glucose: Secondary | ICD-10-CM | POA: Diagnosis not present

## 2021-11-01 DIAGNOSIS — Z6837 Body mass index (BMI) 37.0-37.9, adult: Secondary | ICD-10-CM | POA: Diagnosis not present

## 2021-11-01 DIAGNOSIS — Z713 Dietary counseling and surveillance: Secondary | ICD-10-CM | POA: Diagnosis not present

## 2021-11-08 DIAGNOSIS — Z85850 Personal history of malignant neoplasm of thyroid: Secondary | ICD-10-CM | POA: Diagnosis not present

## 2021-11-08 DIAGNOSIS — E890 Postprocedural hypothyroidism: Secondary | ICD-10-CM | POA: Diagnosis not present

## 2021-11-15 DIAGNOSIS — Z85850 Personal history of malignant neoplasm of thyroid: Secondary | ICD-10-CM | POA: Diagnosis not present

## 2021-11-15 DIAGNOSIS — E890 Postprocedural hypothyroidism: Secondary | ICD-10-CM | POA: Diagnosis not present

## 2021-11-15 DIAGNOSIS — Z23 Encounter for immunization: Secondary | ICD-10-CM | POA: Diagnosis not present

## 2021-11-22 ENCOUNTER — Other Ambulatory Visit: Payer: Self-pay | Admitting: Adult Health

## 2021-11-22 ENCOUNTER — Other Ambulatory Visit: Payer: Self-pay | Admitting: Hematology and Oncology

## 2021-11-22 DIAGNOSIS — C73 Malignant neoplasm of thyroid gland: Secondary | ICD-10-CM

## 2021-11-22 DIAGNOSIS — Z1501 Genetic susceptibility to malignant neoplasm of breast: Secondary | ICD-10-CM

## 2021-11-22 DIAGNOSIS — C50412 Malignant neoplasm of upper-outer quadrant of left female breast: Secondary | ICD-10-CM

## 2021-11-22 DIAGNOSIS — Z1231 Encounter for screening mammogram for malignant neoplasm of breast: Secondary | ICD-10-CM

## 2021-11-23 ENCOUNTER — Ambulatory Visit
Admission: RE | Admit: 2021-11-23 | Discharge: 2021-11-23 | Disposition: A | Discharge status: Home / Self Care | Payer: BC Managed Care – PPO | Source: Ambulatory Visit | Attending: Hematology and Oncology | Admitting: Hematology and Oncology

## 2021-11-23 DIAGNOSIS — C50911 Malignant neoplasm of unspecified site of right female breast: Secondary | ICD-10-CM

## 2021-11-23 DIAGNOSIS — N6489 Other specified disorders of breast: Secondary | ICD-10-CM | POA: Diagnosis not present

## 2021-11-23 MED ORDER — GADOPICLENOL 0.5 MMOL/ML IV SOLN
10.0000 mL | Freq: Once | INTRAVENOUS | Status: AC | PRN
  Administered 2021-11-23: 10 mL via INTRAVENOUS

## 2021-12-12 ENCOUNTER — Telehealth: Payer: Self-pay | Admitting: *Deleted

## 2021-12-12 NOTE — Telephone Encounter (Signed)
Patient called for MRI results from Nov 17/2023. Patient last seen in May 2023 and had repeat MRI in November and is scheduled for 1 year follow up in May 2024.  Dr Chryl Heck informed of patient request and responded: MRI says no evidence of malignancy - please tell patient Contacted patient and provided Dr. Rob Hickman response. Patient verbalized understanding

## 2021-12-18 ENCOUNTER — Other Ambulatory Visit: Payer: BC Managed Care – PPO

## 2021-12-18 ENCOUNTER — Ambulatory Visit: Payer: BC Managed Care – PPO | Admitting: Hematology and Oncology

## 2021-12-27 DIAGNOSIS — E890 Postprocedural hypothyroidism: Secondary | ICD-10-CM | POA: Diagnosis not present

## 2021-12-27 DIAGNOSIS — Z85850 Personal history of malignant neoplasm of thyroid: Secondary | ICD-10-CM | POA: Diagnosis not present

## 2022-01-17 DIAGNOSIS — H53143 Visual discomfort, bilateral: Secondary | ICD-10-CM | POA: Diagnosis not present

## 2022-02-26 DIAGNOSIS — Z86018 Personal history of other benign neoplasm: Secondary | ICD-10-CM | POA: Diagnosis not present

## 2022-02-26 DIAGNOSIS — Z85820 Personal history of malignant melanoma of skin: Secondary | ICD-10-CM | POA: Diagnosis not present

## 2022-02-26 DIAGNOSIS — D225 Melanocytic nevi of trunk: Secondary | ICD-10-CM | POA: Diagnosis not present

## 2022-02-26 DIAGNOSIS — L814 Other melanin hyperpigmentation: Secondary | ICD-10-CM | POA: Diagnosis not present

## 2022-03-04 ENCOUNTER — Telehealth: Payer: Self-pay | Admitting: Hematology and Oncology

## 2022-03-04 NOTE — Telephone Encounter (Signed)
Reached out to patient to reschedule ; no answer.

## 2022-03-04 NOTE — Telephone Encounter (Signed)
Patient called to confirm appointments.

## 2022-03-04 NOTE — Telephone Encounter (Signed)
Reached out to patient to schedule; no answer.

## 2022-05-10 DIAGNOSIS — R5383 Other fatigue: Secondary | ICD-10-CM | POA: Diagnosis not present

## 2022-05-10 DIAGNOSIS — Z0000 Encounter for general adult medical examination without abnormal findings: Secondary | ICD-10-CM | POA: Diagnosis not present

## 2022-05-10 DIAGNOSIS — R7309 Other abnormal glucose: Secondary | ICD-10-CM | POA: Diagnosis not present

## 2022-05-10 DIAGNOSIS — E7800 Pure hypercholesterolemia, unspecified: Secondary | ICD-10-CM | POA: Diagnosis not present

## 2022-05-15 ENCOUNTER — Ambulatory Visit
Admission: RE | Admit: 2022-05-15 | Discharge: 2022-05-15 | Disposition: A | Discharge status: Home / Self Care | Payer: BC Managed Care – PPO | Source: Ambulatory Visit | Attending: Hematology and Oncology | Admitting: Hematology and Oncology

## 2022-05-15 DIAGNOSIS — Z1231 Encounter for screening mammogram for malignant neoplasm of breast: Secondary | ICD-10-CM

## 2022-05-20 ENCOUNTER — Telehealth: Payer: Self-pay | Admitting: Hematology and Oncology

## 2022-05-24 ENCOUNTER — Ambulatory Visit: Payer: BC Managed Care – PPO | Admitting: Hematology and Oncology

## 2022-05-24 ENCOUNTER — Other Ambulatory Visit: Payer: BC Managed Care – PPO

## 2022-05-29 ENCOUNTER — Other Ambulatory Visit: Payer: BC Managed Care – PPO

## 2022-05-29 ENCOUNTER — Ambulatory Visit: Payer: BC Managed Care – PPO | Admitting: Hematology and Oncology

## 2022-06-14 ENCOUNTER — Other Ambulatory Visit: Payer: BC Managed Care – PPO

## 2022-06-14 ENCOUNTER — Ambulatory Visit: Payer: BC Managed Care – PPO | Admitting: Hematology and Oncology

## 2022-06-19 ENCOUNTER — Other Ambulatory Visit: Payer: Self-pay

## 2022-06-19 DIAGNOSIS — C50911 Malignant neoplasm of unspecified site of right female breast: Secondary | ICD-10-CM

## 2022-06-20 ENCOUNTER — Encounter: Payer: Self-pay | Admitting: Hematology and Oncology

## 2022-06-20 ENCOUNTER — Inpatient Hospital Stay: Payer: BC Managed Care – PPO | Attending: Hematology and Oncology

## 2022-06-20 ENCOUNTER — Other Ambulatory Visit: Payer: Self-pay

## 2022-06-20 ENCOUNTER — Inpatient Hospital Stay: Payer: BC Managed Care – PPO | Admitting: Hematology and Oncology

## 2022-06-20 VITALS — BP 121/82 | HR 97 | Temp 97.8°F | Resp 18 | Ht 66.0 in | Wt 226.0 lb

## 2022-06-20 DIAGNOSIS — Z8041 Family history of malignant neoplasm of ovary: Secondary | ICD-10-CM | POA: Insufficient documentation

## 2022-06-20 DIAGNOSIS — Z9221 Personal history of antineoplastic chemotherapy: Secondary | ICD-10-CM | POA: Diagnosis not present

## 2022-06-20 DIAGNOSIS — Z1501 Genetic susceptibility to malignant neoplasm of breast: Secondary | ICD-10-CM

## 2022-06-20 DIAGNOSIS — Z85850 Personal history of malignant neoplasm of thyroid: Secondary | ICD-10-CM | POA: Diagnosis not present

## 2022-06-20 DIAGNOSIS — Z803 Family history of malignant neoplasm of breast: Secondary | ICD-10-CM | POA: Insufficient documentation

## 2022-06-20 DIAGNOSIS — C50911 Malignant neoplasm of unspecified site of right female breast: Secondary | ICD-10-CM

## 2022-06-20 DIAGNOSIS — Z79899 Other long term (current) drug therapy: Secondary | ICD-10-CM | POA: Diagnosis not present

## 2022-06-20 DIAGNOSIS — Z853 Personal history of malignant neoplasm of breast: Secondary | ICD-10-CM | POA: Insufficient documentation

## 2022-06-20 DIAGNOSIS — Z923 Personal history of irradiation: Secondary | ICD-10-CM | POA: Insufficient documentation

## 2022-06-20 LAB — CMP (CANCER CENTER ONLY)
ALT: 18 U/L (ref 0–44)
AST: 14 U/L — ABNORMAL LOW (ref 15–41)
Albumin: 3.9 g/dL (ref 3.5–5.0)
Alkaline Phosphatase: 64 U/L (ref 38–126)
Anion gap: 6 (ref 5–15)
BUN: 14 mg/dL (ref 6–20)
CO2: 29 mmol/L (ref 22–32)
Calcium: 9.3 mg/dL (ref 8.9–10.3)
Chloride: 104 mmol/L (ref 98–111)
Creatinine: 0.72 mg/dL (ref 0.44–1.00)
GFR, Estimated: >60 mL/min (ref >60)
Glucose, Bld: 95 mg/dL (ref 70–99)
Potassium: 4.6 mmol/L (ref 3.5–5.1)
Sodium: 139 mmol/L (ref 135–145)
Total Bilirubin: 0.4 mg/dL (ref 0.3–1.2)
Total Protein: 7.1 g/dL (ref 6.5–8.1)

## 2022-06-20 LAB — CBC WITH DIFFERENTIAL (CANCER CENTER ONLY)
Abs Immature Granulocytes: 0.02 10*3/uL (ref 0.00–0.07)
Basophils Absolute: 0.1 10*3/uL (ref 0.0–0.1)
Basophils Relative: 1 %
Eosinophils Absolute: 0.2 10*3/uL (ref 0.0–0.5)
Eosinophils Relative: 2 %
HCT: 40.5 % (ref 36.0–46.0)
Hemoglobin: 14.1 g/dL (ref 12.0–15.0)
Immature Granulocytes: 0 %
Lymphocytes Relative: 34 %
Lymphs Abs: 2.5 10*3/uL (ref 0.7–4.0)
MCH: 31 pg (ref 26.0–34.0)
MCHC: 34.8 g/dL (ref 30.0–36.0)
MCV: 89 fL (ref 80.0–100.0)
Monocytes Absolute: 0.4 10*3/uL (ref 0.1–1.0)
Monocytes Relative: 6 %
Neutro Abs: 4.1 10*3/uL (ref 1.7–7.7)
Neutrophils Relative %: 57 %
Platelet Count: 312 10*3/uL (ref 150–400)
RBC: 4.55 MIL/uL (ref 3.87–5.11)
RDW: 12.7 % (ref 11.5–15.5)
WBC Count: 7.3 10*3/uL (ref 4.0–10.5)
nRBC: 0 % (ref 0.0–0.2)

## 2022-06-20 NOTE — Progress Notes (Signed)
ID: Robb Matar   DOB: May 13, 1975  MR#: [id number]  ONG#:[id number]  Patient Care Team: Shaune Pollack, MD (Inactive) as PCP - General (Family Medicine) Magrinat, Valentino Hue, MD (Inactive) as Consulting Physician (Oncology) Candice Camp, MD as Consulting Physician (Obstetrics and Gynecology) OTHER MD: Campbell Stall, Debara Pickett  CHIEF COMPLAINT: BRCA positive, estrogen receptor negative breast cancer  CURRENT TREATMENT: Observation  INTERVAL HISTORY:  Yaelis returns today for follow-up of her estrogen receptor negative, BRCA1 associated breast cancer. She continues under observation.  Since her last visit here she had a screening mammogram on May which showed no evidence of malignancy.  She had bilateral salpingo-oophorectomy.  She denies any changes in her breast.  She continues to examine her breast once a month.  She denies any change in bowel habits, unintentional weight loss, skin lesions.  She follows up with dermatology annually.  She was started on metformin for glucose intolerance and her current levothyroxine dose is readjusted 250 mcg.  She overall feels really well.  She is trying to watch what she eats.  She denies any complaints today for this.  COVID 19 VACCINATION STATUS: Refuses vaccinations for SAR S Covid 2; head COVID August 2022, 2 Paxlobid briefly   HISTORY OF PRESENT ILLNESS: From the original intake note:  In mid October of 2010 [place] palpated a mass in her left breast. She brought this to Dr. Corwin Levins attention and he set her up for bilateral mammography October of 2010.  Dr. Jean Rosenthal was able to palpate a mobile nodule in the left breast at the 12:00 position which by mammography showed up as subtle questionable asymmetry. By ultrasound there was an irregular hypoechoic mass measuring 1.7 cm. Biopsy was performed the same day and showed ([id number]) an invasive mammary carcinoma which was estrogen and progesterone receptor negative, with an MIB-1 of 78% and no HER-2  amplification, the ratio being 0.91.   With this information the patient was referred to Dr. Derrell Lolling and bilateral breast MRIs were obtained November 03, 2008. This showed a solitary mass in the left breast measuring 2.2 cm with no evidence of internal mammary or axillary lymph node positivity. At this point the patient was set up for genetic studies. She proved to be BRCA-1 positive. Scans showed no evidence of metastatic disease otherwise a right thyroid nodule noted at that time.   With this information the patient proceeded to left partial mastectomy with axillary lymph node sampling November 2010. The final pathology (S. [id number] [id number]) showed a 2.1 cm invasive ductal carcinoma with negative and ample margins but with evidence of lymphovascular invasion. One out of 3 sentinel lymph nodes was involved by micrometastatic deposit. The tumor was grade 3   Her subsequent history is as detailed below   PAST MEDICAL HISTORY: Past Medical History:  Diagnosis Date   Breast CA ([facility])    breast ca dx 11/10   Melanoma of back ([facility]) 2002   resolved   Personal history of chemotherapy    Personal history of radiation therapy    Significant for melanoma removal approximately 10 years ago from the upper back.   Dr. Campbell Stall did the original excision and Dr. Francina Ames did the deeper surgery.  The patient tells me this was "superficial" and certainly being more than 10 years ago, this is almost certainly cured.  Polycystic ovaries. Status post exploratory laparotomy for what sounds like it may have been mild endometriosis.     PAST SURGICAL HISTORY: Past Surgical History:  Procedure Laterality  Date   BREAST LUMPECTOMY Left 2010   EXPLORATORY LAPAROTOMY  remote   for possible endometriosis   THYROIDECTOMY  January 2011   for papillary thyroid CA    FAMILY HISTORY Family History  Problem Relation Age of Onset   Breast cancer Mother 60       survived   Ovarian cancer Sister 24       died  The  patient's father was murdered in his home at age 6.  Her mother had breast cancer diagnosed in her sixties.  She is 48 years old as of December 2020 the patient's mother's only sister only sister had ovarian cancer diagnosed in her 44s and died from that disease. The patient has one brother and one sister, both in good health. The patient's sister has been tested for the BRCA mutation and was found to be positive. She has undergone bilateral salpingo-oophorectomy and according to the patient is being appropriately screened.   GYNECOLOGIC HISTORY: The patient is [place] P0; she never took oral contraceptives; s/p BSO November 2011 with benign pathology   SOCIAL HISTORY: (updated December 2021 She worked part time at [employer] in Clinical biochemist.  Her husband, Alycia Rossetti, used to work for [employer] in [employer], but now they have opened their own business [employer], working primarily on [name] cars. Naadira also works at [employer]. They have no children.  The patient is not a church attender.     ADVANCED DIRECTIVES: In the absence of any documents to the contrary the patient's husband is her healthcare power of attorney   HEALTH MAINTENANCE: Social History   Tobacco Use   Smoking status: Never   Smokeless tobacco: Never  Substance Use Topics   Alcohol use: No   Drug use: No     Colonoscopy:  PAP: UTD/Mezer  Bone density:  Lipid panel:  Allergies  Allergen Reactions   Neomycin-Bacitracin Zn-Polymyx Rash    Current Outpatient Medications  Medication Sig Dispense Refill   Calcium Carbonate-Vitamin D (CALCIUM 600 + D PO) Take 1 tablet by mouth daily.       gabapentin (NEURONTIN) 100 MG capsule TAKE 1 CAPSULE(100 MG) BY MOUTH AT BEDTIME 90 capsule 3   SYNTHROID 175 MCG tablet TK 1 T PO QAM ON AN EMPTY STOMACH  12   No current facility-administered medications for this visit.    OBJECTIVE: White woman who appears well  There were no vitals filed for  this visit.     There is no height or weight on file to calculate BMI.    ECOG FS: 0 There were no vitals filed for this visit.   Physical Exam Constitutional:      Appearance: Normal appearance.  Cardiovascular:     Rate and Rhythm: Normal rate and regular rhythm.  Chest:     Comments: Bilateral breast examined.  Left breast status postlumpectomy.  No palpable masses no regional adenopathy.  Right breast with Steri-Strips in place.  No palpable masses again and no regional adenopathy Musculoskeletal:        General: No swelling. Normal range of motion.     Cervical back: Normal range of motion and neck supple. No rigidity.  Lymphadenopathy:     Cervical: No cervical adenopathy.  Neurological:     Mental Status: She is alert.       LAB RESULTS: Lab Results  Component Value Date   WBC 6.4 12/18/2020   NEUTROABS 4.0 12/18/2020   HGB 13.8 12/18/2020  HCT 39.7 12/18/2020   MCV 87.3 12/18/2020   PLT 331 12/18/2020      Chemistry      Component Value Date/Time   NA 139 12/18/2020 1044   NA 140 12/02/2016 1303   K 3.9 12/18/2020 1044   K 3.6 12/02/2016 1303   CL 105 12/18/2020 1044   CL 103 05/25/2012 1457   CO2 25 12/18/2020 1044   CO2 26 12/02/2016 1303   BUN 14 12/18/2020 1044   BUN 14.8 12/02/2016 1303   CREATININE 0.81 12/18/2020 1044   CREATININE 0.8 12/02/2016 1303      Component Value Date/Time   CALCIUM 8.8 (L) 12/18/2020 1044   CALCIUM 8.8 12/02/2016 1303   ALKPHOS 69 12/18/2020 1044   ALKPHOS 60 12/02/2016 1303   AST 12 (L) 12/18/2020 1044   AST 13 12/02/2016 1303   ALT 15 12/18/2020 1044   ALT 14 12/02/2016 1303   BILITOT 0.4 12/18/2020 1044   BILITOT 0.37 12/02/2016 1303      STUDIES: No results found.   ASSESSMENT: 47 y.o.  BRCA-1 positive [place] woman    (1) status post post left lumpectomy and sentinel lymph node dissection November of 2010 for a T2 N1(mic), stage IB invasive ductal carcinoma, triple negative, with an MIB-1 of 78%,  treated adjuvantly with carboplatin and paclitaxel x3 followed by dose dense doxorubicin and cyclophosphamide x4 followed by radiation completed August of 2011   (2) papillary thyroid carcinoma post total thyroidectomy in January of 2011 with 5 of 6 lymph nodes involved, status post radioiodine ablation, with post treatment hypothyroidism, followed through doctor Kerr's office   (3) status post removal of a nevoid melanoma, focally superficial level IV, B[place]low depth 0.46 mm, from the mid to upper back, with margins initially involved 03/13/1998, status post excision of multiple dysplastic nevi since that time, followed by Dr. Danella Deis  (4) s/p BSO November 2011 with benign pathology  (5) BRCA1 carries a deleterious mutation ([id number]); there was no mutation in the BRCA2 gene (testing 11/04/2008)  (6) intensified screening:  (A) mammography April or May yearly  (B) breast MRI October or November yearly   PLAN:   She continues on intensified screening, with an MRI in April and mammography in October or November.   Most recent mammogram without any concerns. Breast exam today without any concerns. She will continue to follow-up with endocrinology as well as dermatology for screening. She was instructed to call us sooner with any questions or concerns.  She will otherwise return to clinic in 1 year. MRI ordered for November this year.  Total time spent: 20 minutes, patient is a new patient to me transitioning from Dr. Darnelle Catalan upon his retirement.  *Total Encounter Time as defined by the Centers for Medicare and Medicaid Services includes, in addition to the face-to-face time of a patient visit (documented in the note above) non-face-to-face time: obtaining and reviewing outside history, ordering and reviewing medications, tests or procedures, care coordination (communications with other health care professionals or caregivers) and documentation in the medical record.

## 2022-08-05 DIAGNOSIS — L810 Postinflammatory hyperpigmentation: Secondary | ICD-10-CM | POA: Diagnosis not present

## 2022-10-23 DIAGNOSIS — Z124 Encounter for screening for malignant neoplasm of cervix: Secondary | ICD-10-CM | POA: Diagnosis not present

## 2022-10-23 DIAGNOSIS — Z01419 Encounter for gynecological examination (general) (routine) without abnormal findings: Secondary | ICD-10-CM | POA: Diagnosis not present

## 2022-10-23 DIAGNOSIS — Z6836 Body mass index (BMI) 36.0-36.9, adult: Secondary | ICD-10-CM | POA: Diagnosis not present

## 2022-11-14 DIAGNOSIS — Z7984 Long term (current) use of oral hypoglycemic drugs: Secondary | ICD-10-CM | POA: Diagnosis not present

## 2022-11-14 DIAGNOSIS — R7303 Prediabetes: Secondary | ICD-10-CM | POA: Diagnosis not present

## 2022-11-14 DIAGNOSIS — Z85850 Personal history of malignant neoplasm of thyroid: Secondary | ICD-10-CM | POA: Diagnosis not present

## 2022-11-14 DIAGNOSIS — E890 Postprocedural hypothyroidism: Secondary | ICD-10-CM | POA: Diagnosis not present

## 2022-11-21 DIAGNOSIS — E890 Postprocedural hypothyroidism: Secondary | ICD-10-CM | POA: Diagnosis not present

## 2022-11-21 DIAGNOSIS — Z87898 Personal history of other specified conditions: Secondary | ICD-10-CM | POA: Diagnosis not present

## 2022-11-21 DIAGNOSIS — Z85850 Personal history of malignant neoplasm of thyroid: Secondary | ICD-10-CM | POA: Diagnosis not present

## 2022-11-21 DIAGNOSIS — Z23 Encounter for immunization: Secondary | ICD-10-CM | POA: Diagnosis not present

## 2022-11-23 ENCOUNTER — Other Ambulatory Visit: Payer: Self-pay | Admitting: Adult Health

## 2022-11-23 DIAGNOSIS — Z1501 Genetic susceptibility to malignant neoplasm of breast: Secondary | ICD-10-CM

## 2022-11-23 DIAGNOSIS — C73 Malignant neoplasm of thyroid gland: Secondary | ICD-10-CM

## 2022-11-23 DIAGNOSIS — C50412 Malignant neoplasm of upper-outer quadrant of left female breast: Secondary | ICD-10-CM

## 2022-12-02 DIAGNOSIS — S92515A Nondisplaced fracture of proximal phalanx of left lesser toe(s), initial encounter for closed fracture: Secondary | ICD-10-CM | POA: Diagnosis not present

## 2022-12-11 DIAGNOSIS — S92515D Nondisplaced fracture of proximal phalanx of left lesser toe(s), subsequent encounter for fracture with routine healing: Secondary | ICD-10-CM | POA: Diagnosis not present

## 2022-12-24 ENCOUNTER — Ambulatory Visit
Admission: RE | Admit: 2022-12-24 | Discharge: 2022-12-24 | Disposition: A | Discharge status: Home / Self Care | Payer: BC Managed Care – PPO | Source: Ambulatory Visit | Attending: Hematology and Oncology

## 2022-12-24 DIAGNOSIS — Z1239 Encounter for other screening for malignant neoplasm of breast: Secondary | ICD-10-CM | POA: Diagnosis not present

## 2022-12-24 DIAGNOSIS — C50911 Malignant neoplasm of unspecified site of right female breast: Secondary | ICD-10-CM

## 2022-12-24 MED ORDER — GADOPICLENOL 0.5 MMOL/ML IV SOLN
10.0000 mL | Freq: Once | INTRAVENOUS | Status: AC | PRN
  Administered 2022-12-24: 10 mL via INTRAVENOUS

## 2023-01-14 DIAGNOSIS — S92515D Nondisplaced fracture of proximal phalanx of left lesser toe(s), subsequent encounter for fracture with routine healing: Secondary | ICD-10-CM | POA: Diagnosis not present

## 2023-01-28 DIAGNOSIS — Z13820 Encounter for screening for osteoporosis: Secondary | ICD-10-CM | POA: Diagnosis not present

## 2023-01-28 DIAGNOSIS — H53143 Visual discomfort, bilateral: Secondary | ICD-10-CM | POA: Diagnosis not present

## 2023-03-25 DIAGNOSIS — Z86018 Personal history of other benign neoplasm: Secondary | ICD-10-CM | POA: Diagnosis not present

## 2023-03-25 DIAGNOSIS — D225 Melanocytic nevi of trunk: Secondary | ICD-10-CM | POA: Diagnosis not present

## 2023-03-25 DIAGNOSIS — L814 Other melanin hyperpigmentation: Secondary | ICD-10-CM | POA: Diagnosis not present

## 2023-03-25 DIAGNOSIS — Z85820 Personal history of malignant melanoma of skin: Secondary | ICD-10-CM | POA: Diagnosis not present

## 2023-04-21 ENCOUNTER — Other Ambulatory Visit: Payer: Self-pay | Admitting: Hematology and Oncology

## 2023-04-21 DIAGNOSIS — Z1231 Encounter for screening mammogram for malignant neoplasm of breast: Secondary | ICD-10-CM

## 2023-05-16 ENCOUNTER — Encounter (HOSPITAL_COMMUNITY): Payer: Self-pay

## 2023-05-23 ENCOUNTER — Ambulatory Visit

## 2023-05-29 DIAGNOSIS — R5383 Other fatigue: Secondary | ICD-10-CM | POA: Diagnosis not present

## 2023-05-29 DIAGNOSIS — Z0000 Encounter for general adult medical examination without abnormal findings: Secondary | ICD-10-CM | POA: Diagnosis not present

## 2023-05-29 DIAGNOSIS — R7303 Prediabetes: Secondary | ICD-10-CM | POA: Diagnosis not present

## 2023-05-29 DIAGNOSIS — E7800 Pure hypercholesterolemia, unspecified: Secondary | ICD-10-CM | POA: Diagnosis not present

## 2023-06-06 ENCOUNTER — Ambulatory Visit
Admission: RE | Admit: 2023-06-06 | Discharge: 2023-06-06 | Disposition: A | Discharge status: Home / Self Care | Source: Ambulatory Visit | Attending: Hematology and Oncology | Admitting: Hematology and Oncology

## 2023-06-06 DIAGNOSIS — Z1231 Encounter for screening mammogram for malignant neoplasm of breast: Secondary | ICD-10-CM

## 2023-06-19 ENCOUNTER — Telehealth: Payer: Self-pay

## 2023-06-19 NOTE — Telephone Encounter (Signed)
 Verbally confirmed appt for 6/16

## 2023-06-23 ENCOUNTER — Other Ambulatory Visit: Payer: Self-pay | Admitting: *Deleted

## 2023-06-23 ENCOUNTER — Inpatient Hospital Stay: Payer: BC Managed Care – PPO

## 2023-06-23 ENCOUNTER — Encounter: Payer: Self-pay | Admitting: Hematology and Oncology

## 2023-06-23 ENCOUNTER — Inpatient Hospital Stay: Payer: BC Managed Care – PPO | Attending: Hematology and Oncology | Admitting: Hematology and Oncology

## 2023-06-23 VITALS — BP 99/46 | HR 103 | Temp 98.2°F | Resp 17 | Wt 224.9 lb

## 2023-06-23 DIAGNOSIS — Z9221 Personal history of antineoplastic chemotherapy: Secondary | ICD-10-CM | POA: Diagnosis not present

## 2023-06-23 DIAGNOSIS — Z1501 Genetic susceptibility to malignant neoplasm of breast: Secondary | ICD-10-CM | POA: Diagnosis not present

## 2023-06-23 DIAGNOSIS — Z1509 Genetic susceptibility to other malignant neoplasm: Secondary | ICD-10-CM | POA: Diagnosis not present

## 2023-06-23 DIAGNOSIS — C50911 Malignant neoplasm of unspecified site of right female breast: Secondary | ICD-10-CM

## 2023-06-23 DIAGNOSIS — Z853 Personal history of malignant neoplasm of breast: Secondary | ICD-10-CM | POA: Diagnosis not present

## 2023-06-23 DIAGNOSIS — Z923 Personal history of irradiation: Secondary | ICD-10-CM | POA: Insufficient documentation

## 2023-06-23 DIAGNOSIS — Z1502 Genetic susceptibility to malignant neoplasm of ovary: Secondary | ICD-10-CM | POA: Insufficient documentation

## 2023-06-23 LAB — CBC WITH DIFFERENTIAL (CANCER CENTER ONLY)
Abs Immature Granulocytes: 0.02 10*3/uL (ref 0.00–0.07)
Basophils Absolute: 0 10*3/uL (ref 0.0–0.1)
Basophils Relative: 1 %
Eosinophils Absolute: 0.2 10*3/uL (ref 0.0–0.5)
Eosinophils Relative: 3 %
HCT: 40.6 % (ref 36.0–46.0)
Hemoglobin: 13.8 g/dL (ref 12.0–15.0)
Immature Granulocytes: 0 %
Lymphocytes Relative: 33 %
Lymphs Abs: 2.1 10*3/uL (ref 0.7–4.0)
MCH: 28.9 pg (ref 26.0–34.0)
MCHC: 34 g/dL (ref 30.0–36.0)
MCV: 85.1 fL (ref 80.0–100.0)
Monocytes Absolute: 0.4 10*3/uL (ref 0.1–1.0)
Monocytes Relative: 7 %
Neutro Abs: 3.7 10*3/uL (ref 1.7–7.7)
Neutrophils Relative %: 56 %
Platelet Count: 345 10*3/uL (ref 150–400)
RBC: 4.77 MIL/uL (ref 3.87–5.11)
RDW: 12.8 % (ref 11.5–15.5)
WBC Count: 6.5 10*3/uL (ref 4.0–10.5)
nRBC: 0 % (ref 0.0–0.2)

## 2023-06-23 LAB — CMP (CANCER CENTER ONLY)
ALT: 16 U/L (ref 0–44)
AST: 15 U/L (ref 15–41)
Albumin: 4.3 g/dL (ref 3.5–5.0)
Alkaline Phosphatase: 61 U/L (ref 38–126)
Anion gap: 7 (ref 5–15)
BUN: 16 mg/dL (ref 6–20)
CO2: 30 mmol/L (ref 22–32)
Calcium: 9.2 mg/dL (ref 8.9–10.3)
Chloride: 104 mmol/L (ref 98–111)
Creatinine: 0.78 mg/dL (ref 0.44–1.00)
GFR, Estimated: >60 mL/min (ref >60)
Glucose, Bld: 71 mg/dL (ref 70–99)
Potassium: 3.8 mmol/L (ref 3.5–5.1)
Sodium: 141 mmol/L (ref 135–145)
Total Bilirubin: 0.5 mg/dL (ref 0.0–1.2)
Total Protein: 7.1 g/dL (ref 6.5–8.1)

## 2023-06-23 NOTE — Progress Notes (Signed)
 ID: Audrey Chambers   DOB: 01-11-1975  MR#: [id number]  OVZ#:[id number]  Patient Care Team: Ransom Byers, MD as PCP - General (Family Medicine) Magrinat, Rozella Cornfield, MD (Inactive) as Consulting Physician (Oncology) Belle Box, MD as Consulting Physician (Obstetrics and Gynecology) OTHER MD: Wolm Hawthorne, Eleni Griffin  CHIEF COMPLAINT: BRCA positive, estrogen receptor negative breast cancer  CURRENT TREATMENT: Observation  INTERVAL HISTORY:  Audrey Chambers returns today for follow-up of her estrogen receptor negative, BRCA1 associated breast cancer.   Discussed the use of AI scribe software for clinical note transcription with the patient, who gave verbal consent to proceed.  History of Present Illness Audrey Chambers is a 48 year old female with a BRCA1 mutation who presents for routine follow-up and intensive screening.  She is undergoing routine follow-up and intensive screening due to her BRCA1 mutation. She alternates between mammograms and MRIs as part of her screening protocol. Her recent mammogram was normal, and she plans to continue with annual MRIs.  She underwent a bilateral salpingo-oophorectomy in 2010 or 2011 and has not had any surgeries since.  She has a history of melanoma diagnosed in 2000 and continues to follow up with dermatology for annual skin checks.  There are no changes in her breast and no updated family history of cancer. She regularly sees an endocrinologist and has had her physical exam this year.     COVID 19 VACCINATION STATUS: Refuses vaccinations for SAR S Covid 2; head COVID August 2022, 2 Paxlobid briefly   HISTORY OF PRESENT ILLNESS: From the original intake note:  In mid October of 2010 Juhi palpated a mass in her left breast. She brought this to Dr. Mellissa Sprinkles attention and he set her up for bilateral mammography October of 2010.  Dr. Cleora Daft was able to palpate a mobile nodule in the left breast at the 12:00 position which by mammography  showed up as subtle questionable asymmetry. By ultrasound there was an irregular hypoechoic mass measuring 1.7 cm. Biopsy was performed the same day and showed ([id number]) an invasive mammary carcinoma which was estrogen and progesterone receptor negative, with an MIB-1 of 78% and no HER-2 amplification, the ratio being 0.91.   With this information the patient was referred to Dr. Lauralee Poll and bilateral breast MRIs were obtained November 03, 2008. This showed a solitary mass in the left breast measuring 2.2 cm with no evidence of internal mammary or axillary lymph node positivity. At this point the patient was set up for genetic studies. She proved to be BRCA-1 positive. Scans showed no evidence of metastatic disease otherwise a right thyroid  nodule noted at that time.   With this information the patient proceeded to left partial mastectomy with axillary lymph node sampling November 2010. The final pathology (S. [id number]) showed a 2.1 cm invasive ductal carcinoma with negative and ample margins but with evidence of lymphovascular invasion. One out of 3 sentinel lymph nodes was involved by micrometastatic deposit. The tumor was grade 3   Her subsequent history is as detailed below   PAST MEDICAL HISTORY: Past Medical History:  Diagnosis Date   Breast CA (HCC)    breast ca dx 11/10   Melanoma of back (HCC) 2002   resolved   Personal history of chemotherapy    Personal history of radiation therapy    Significant for melanoma removal approximately 10 years ago from the upper back.   Dr. Wolm Hawthorne did the original excision and Dr. Aldo Amble did the deeper surgery.  The  patient tells me this was "superficial" and certainly being more than 10 years ago, this is almost certainly cured.  Polycystic ovaries. Status post exploratory laparotomy for what sounds like it may have been mild endometriosis.     PAST SURGICAL HISTORY: Past Surgical History:  Procedure Laterality Date   BREAST LUMPECTOMY  Left 2010   EXPLORATORY LAPAROTOMY  remote   for possible endometriosis   THYROIDECTOMY  January 2011   for papillary thyroid  [place]    FAMILY HISTORY Family History  Problem Relation Age of Onset   Breast cancer Mother 33       survived   Ovarian cancer Sister 70       died  The patient's father was murdered in his home at age 98.  Her mother had breast cancer diagnosed in her sixties.  She is 48 years old as of December 2020 the patient's mother's only sister only sister had ovarian cancer diagnosed in her 74s and died from that disease. The patient has one brother and one sister, both in good health. The patient's sister has been tested for the BRCA mutation and was found to be positive. She has undergone bilateral salpingo-oophorectomy and according to the patient is being appropriately screened.   GYNECOLOGIC HISTORY: The patient is GX P0; she never took oral contraceptives; s/p BSO November 2011 with benign pathology   SOCIAL HISTORY: (updated December 2021 She worked part time at [employer] in Clinical biochemist.  Her husband, Verdie Gladden, used to work for [employer] in [place], but now they have opened their own business [employer], working primarily on [place]. Cyndra also works at [employer]. They have no children.  The patient is not a church attender.     ADVANCED DIRECTIVES: In the absence of any documents to the contrary the patient's husband is her healthcare power of attorney   HEALTH MAINTENANCE: Social History   Tobacco Use   Smoking status: Never   Smokeless tobacco: Never  Substance Use Topics   Alcohol use: No   Drug use: No     Colonoscopy:  PAP: UTD/Mezer  Bone density:  Lipid panel:  Allergies  Allergen Reactions   Neomycin-Bacitracin Zn-Polymyx Rash    Current Outpatient Medications  Medication Sig Dispense Refill   Calcium Carbonate-Vitamin D  (CALCIUM 600 + D PO) Take 1 tablet by mouth daily.       gabapentin   (NEURONTIN ) 100 MG capsule TAKE 1 CAPSULE(100 MG) BY MOUTH AT BEDTIME 90 capsule 3   metFORMIN (GLUCOPHAGE) 500 MG tablet Take 500 mg by mouth daily.     SYNTHROID 175 MCG tablet TK 1 T PO QAM ON AN EMPTY STOMACH  12   No current facility-administered medications for this visit.    OBJECTIVE: White woman who appears well  Vitals:   06/23/23 0941  BP: (!) 99/46  Pulse: (!) 103  Resp: 17  Temp: 98.2 F (36.8 C)  SpO2: 100%      Body mass index is 36.3 kg/m.    ECOG FS: 0 Filed Weights   06/23/23 0941  Weight: 224 lb 14.4 oz (102 kg)    Physical Exam Constitutional:      Appearance: Normal appearance.   Cardiovascular:     Rate and Rhythm: Normal rate and regular rhythm.  Chest:     Comments: Bilateral breast examined. No palpable masses both breasts. No regional adenopathy  Musculoskeletal:        General: No swelling. Normal range of motion.  Cervical back: Normal range of motion and neck supple. No rigidity.  Lymphadenopathy:     Cervical: No cervical adenopathy.   Neurological:     Mental Status: She is alert.       LAB RESULTS: Lab Results  Component Value Date   WBC 6.5 06/23/2023   NEUTROABS 3.7 06/23/2023   HGB 13.8 06/23/2023   HCT 40.6 06/23/2023   MCV 85.1 06/23/2023   PLT 345 06/23/2023      Chemistry      Component Value Date/Time   NA 139 06/20/2022 0914   NA 140 12/02/2016 1303   K 4.6 06/20/2022 0914   K 3.6 12/02/2016 1303   CL 104 06/20/2022 0914   CL 103 05/25/2012 1457   CO2 29 06/20/2022 0914   CO2 26 12/02/2016 1303   BUN 14 06/20/2022 0914   BUN 14.8 12/02/2016 1303   CREATININE 0.72 06/20/2022 0914   CREATININE 0.8 12/02/2016 1303      Component Value Date/Time   CALCIUM 9.3 06/20/2022 0914   CALCIUM 8.8 12/02/2016 1303   ALKPHOS 64 06/20/2022 0914   ALKPHOS 60 12/02/2016 1303   AST 14 (L) 06/20/2022 0914   AST 13 12/02/2016 1303   ALT 18 06/20/2022 0914   ALT 14 12/02/2016 1303   BILITOT 0.4 06/20/2022 0914    BILITOT 0.37 12/02/2016 1303      STUDIES: MM 3D SCREENING MAMMOGRAM BILATERAL BREAST Result Date: 06/11/2023 CLINICAL DATA:  Screening. EXAM: DIGITAL SCREENING BILATERAL MAMMOGRAM WITH TOMOSYNTHESIS AND CAD TECHNIQUE: Bilateral screening digital craniocaudal and mediolateral oblique mammograms were obtained. Bilateral screening digital breast tomosynthesis was performed. The images were evaluated with computer-aided detection. COMPARISON:  Previous exam(s). ACR Breast Density Category b: There are scattered areas of fibroglandular density. FINDINGS: There are no findings suspicious for malignancy. IMPRESSION: No mammographic evidence of malignancy. A result letter of this screening mammogram will be mailed directly to the patient. RECOMMENDATION: Screening mammogram in one year. (Code:SM-B-01Y) BI-RADS CATEGORY  1: Negative. Electronically Signed   By: Amanda Jungling M.D.   On: 06/11/2023 10:12     ASSESSMENT: 48 y.o.  BRCA-1 positive [place] woman    (1) status post post left lumpectomy and sentinel lymph node dissection November of 2010 for a T2 N1(mic), stage IB invasive ductal carcinoma, triple negative, with an MIB-1 of 78%, treated adjuvantly with carboplatin and paclitaxel x3 followed by dose dense doxorubicin and cyclophosphamide x4 followed by radiation completed August of 2011   (2) papillary thyroid  carcinoma post total thyroidectomy in January of 2011 with 5 of 6 lymph nodes involved, status post radioiodine ablation, with post treatment hypothyroidism, followed through doctor Kerr's office   (3) status post removal of a nevoid melanoma, focally superficial level IV, Breslow depth 0.46 mm, from the mid to upper back, with margins initially involved 03/13/1998, status post excision of multiple dysplastic nevi since that time, followed by Dr. Alanda Allegra  (4) s/p BSO November 2011 with benign pathology  (5) BRCA1 carries a deleterious mutation ([id number]); there was no mutation in the BRCA2  gene (testing 11/04/2008)  (6) intensified screening:  (A) mammography April or May yearly  (B) breast MRI October or November yearly   PLAN:   Assessment and Plan Assessment & Plan BRCA1 gene mutation BRCA1 mutation increases breast and ovarian cancer risk. Prefers intensive screening over surgery. Acknowledged MRI's sensitivity over mammography but understands screening limitations. - Continue alternating annual mammogram and MRI screenings. - Schedule MRI in mid-December.  Breast cancer, BRCA 1  mutation. Diagnosed in 2010, in remission for 15 years. No new symptoms or changes. - Schedule follow-up physical exam every six months, alternating with gynecologist if possible.  Melanoma No new lesions or changes. - Continue annual dermatological skin checks.  Hypotensive  Asymptomatic, no antihypertensive use. Advised on hydration. - Advise increased water intake for the next few days.  Total time spent: 30 minutes, patient is a new patient to me transitioning from Dr. Charolett Copes upon his retirement.  *Total Encounter Time as defined by the Centers for Medicare and Medicaid Services includes, in addition to the face-to-face time of a patient visit (documented in the note above) non-face-to-face time: obtaining and reviewing outside history, ordering and reviewing medications, tests or procedures, care coordination (communications with other health care professionals or caregivers) and documentation in the medical record.

## 2023-08-19 ENCOUNTER — Other Ambulatory Visit: Payer: Self-pay | Admitting: Hematology and Oncology

## 2023-08-19 DIAGNOSIS — Z1509 Genetic susceptibility to other malignant neoplasm: Secondary | ICD-10-CM

## 2023-08-19 DIAGNOSIS — Z1501 Genetic susceptibility to malignant neoplasm of breast: Secondary | ICD-10-CM

## 2023-08-19 DIAGNOSIS — C50412 Malignant neoplasm of upper-outer quadrant of left female breast: Secondary | ICD-10-CM

## 2023-08-19 DIAGNOSIS — C73 Malignant neoplasm of thyroid gland: Secondary | ICD-10-CM

## 2023-11-13 DIAGNOSIS — Z87898 Personal history of other specified conditions: Secondary | ICD-10-CM | POA: Diagnosis not present

## 2023-11-13 DIAGNOSIS — E890 Postprocedural hypothyroidism: Secondary | ICD-10-CM | POA: Diagnosis not present

## 2023-11-13 DIAGNOSIS — Z131 Encounter for screening for diabetes mellitus: Secondary | ICD-10-CM | POA: Diagnosis not present

## 2023-11-13 DIAGNOSIS — Z85850 Personal history of malignant neoplasm of thyroid: Secondary | ICD-10-CM | POA: Diagnosis not present

## 2023-11-20 DIAGNOSIS — Z85850 Personal history of malignant neoplasm of thyroid: Secondary | ICD-10-CM | POA: Diagnosis not present

## 2023-11-20 DIAGNOSIS — E890 Postprocedural hypothyroidism: Secondary | ICD-10-CM | POA: Diagnosis not present

## 2023-11-20 DIAGNOSIS — Z23 Encounter for immunization: Secondary | ICD-10-CM | POA: Diagnosis not present

## 2023-12-25 ENCOUNTER — Inpatient Hospital Stay: Admission: RE | Admit: 2023-12-25 | Discharge: 2023-12-25 | Attending: Hematology and Oncology

## 2023-12-25 DIAGNOSIS — Z1239 Encounter for other screening for malignant neoplasm of breast: Secondary | ICD-10-CM | POA: Diagnosis not present

## 2023-12-25 DIAGNOSIS — Z15060 Genetic susceptibility to colorectal cancer: Secondary | ICD-10-CM

## 2023-12-25 MED ORDER — GADOPICLENOL 0.5 MMOL/ML IV SOLN
7.5000 mL | Freq: Once | INTRAVENOUS | Status: AC | PRN
  Administered 2023-12-25: 12:00:00 7.5 mL via INTRAVENOUS

## 2024-01-06 ENCOUNTER — Inpatient Hospital Stay: Attending: Hematology and Oncology | Admitting: Hematology and Oncology

## 2024-01-06 VITALS — BP 128/65 | HR 95 | Temp 98.0°F | Resp 16 | Wt 218.3 lb

## 2024-01-06 DIAGNOSIS — Z85850 Personal history of malignant neoplasm of thyroid: Secondary | ICD-10-CM | POA: Insufficient documentation

## 2024-01-06 DIAGNOSIS — Z860100 Personal history of colon polyps, unspecified: Secondary | ICD-10-CM | POA: Diagnosis not present

## 2024-01-06 DIAGNOSIS — Z1502 Genetic susceptibility to malignant neoplasm of ovary: Secondary | ICD-10-CM | POA: Insufficient documentation

## 2024-01-06 DIAGNOSIS — Z1509 Genetic susceptibility to other malignant neoplasm: Secondary | ICD-10-CM | POA: Diagnosis not present

## 2024-01-06 DIAGNOSIS — Z8041 Family history of malignant neoplasm of ovary: Secondary | ICD-10-CM | POA: Diagnosis not present

## 2024-01-06 DIAGNOSIS — Z15068 Genetic susceptibility to other malignant neoplasm of digestive system: Secondary | ICD-10-CM

## 2024-01-06 DIAGNOSIS — Z923 Personal history of irradiation: Secondary | ICD-10-CM | POA: Insufficient documentation

## 2024-01-06 DIAGNOSIS — Z853 Personal history of malignant neoplasm of breast: Secondary | ICD-10-CM | POA: Diagnosis present

## 2024-01-06 DIAGNOSIS — Z85820 Personal history of malignant melanoma of skin: Secondary | ICD-10-CM | POA: Diagnosis not present

## 2024-01-06 DIAGNOSIS — Z9221 Personal history of antineoplastic chemotherapy: Secondary | ICD-10-CM | POA: Diagnosis not present

## 2024-01-06 DIAGNOSIS — Z1501 Genetic susceptibility to malignant neoplasm of breast: Secondary | ICD-10-CM | POA: Insufficient documentation

## 2024-01-06 DIAGNOSIS — Z15060 Genetic susceptibility to colorectal cancer: Secondary | ICD-10-CM | POA: Diagnosis not present

## 2024-01-06 DIAGNOSIS — Z1589 Genetic susceptibility to other disease: Secondary | ICD-10-CM

## 2024-01-06 DIAGNOSIS — Z803 Family history of malignant neoplasm of breast: Secondary | ICD-10-CM | POA: Diagnosis not present

## 2024-01-06 NOTE — Progress Notes (Signed)
 ID: Audrey Chambers   DOB: 08-22-1975  MR#: [id number]  RDW#:[id number]  Patient Care Team: Chrystal Lamarr RAMAN, MD as PCP - General (Family Medicine) Marget Lenis, MD as Consulting Physician (Obstetrics and Gynecology) OTHER MD: Marc Habermann, Chyrl Alexander  CHIEF COMPLAINT: BRCA positive, estrogen receptor negative breast cancer  CURRENT TREATMENT: Observation  INTERVAL HISTORY:  Audrey Chambers returns today for follow-up of her estrogen receptor negative, BRCA1 associated breast cancer.   Discussed the use of AI scribe software for clinical note transcription with the patient, who gave verbal consent to proceed.  History of Present Illness  Audrey Chambers is a 48 year old female with BRCA1 mutation, prior malignant melanoma, and bilateral salpingo-oophorectomy who presents for routine oncology follow-up and cancer surveillance.  She underwent breast MRI last week as part of her intensive screening regimen. She continues monthly breast self-examinations and is scheduled for her next mammogram in May or June. She has a history of bilateral salpingo-oophorectomy.  She had a colonoscopy yesterday with removal of two polyps; pathology is pending. Her prior colonoscopy three years ago revealed several polyps. The proceduralist expressed no immediate concern and stated that the need for repeat colonoscopy would be determined based on the current results. She experienced transient localized nasal soreness post-procedure, which has resolved.  She continues annual dermatologic surveillance for melanoma, with her last excision in 2000 and no evidence of recurrence. She remains compliant with routine Pap smears and endocrine follow-up for hypothyroidism, with recent thyroid  function reported as normal.  She is maintaining regular cancer screening and surveillance.    COVID 19 VACCINATION STATUS: Refuses vaccinations for SAR S Covid 2; head COVID August 2022, 2 Paxlobid briefly   HISTORY OF PRESENT  ILLNESS: From the original intake note:  In mid October of 2010 Winter palpated a mass in her left breast. She brought this to Dr. Elise attention and he set her up for bilateral mammography October of 2010.  Dr. Leonce was able to palpate a mobile nodule in the left breast at the 12:00 position which by mammography showed up as subtle questionable asymmetry. By ultrasound there was an irregular hypoechoic mass measuring 1.7 cm. Biopsy was performed the same day and showed ([id number]) an invasive mammary carcinoma which was estrogen and progesterone receptor negative, with an MIB-1 of 78% and no HER-2 amplification, the ratio being 0.91.   With this information the patient was referred to Dr. Gail and bilateral breast MRIs were obtained November 03, 2008. This showed a solitary mass in the left breast measuring 2.2 cm with no evidence of internal mammary or axillary lymph node positivity. At this point the patient was set up for genetic studies. She proved to be BRCA-1 positive. Scans showed no evidence of metastatic disease otherwise a right thyroid  nodule noted at that time.   With this information the patient proceeded to left partial mastectomy with axillary lymph node sampling November 2010. The final pathology (S. [id number] [id number]) showed a 2.1 cm invasive ductal carcinoma with negative and ample margins but with evidence of lymphovascular invasion. One out of 3 sentinel lymph nodes was involved by micrometastatic deposit. The tumor was grade 3   Her subsequent history is as detailed below   PAST MEDICAL HISTORY: Past Medical History:  Diagnosis Date   Breast CA (HCC)    breast ca dx 11/10   Melanoma of back (HCC) 2002   resolved   Personal history of chemotherapy    Personal history of radiation therapy    Significant for  melanoma removal approximately 10 years ago from the upper back.   Dr. Lorrayne Seashore did the original excision and Dr. Maude Salt did the deeper surgery.  The patient  tells me this was superficial and certainly being more than 10 years ago, this is almost certainly cured.  Polycystic ovaries. Status post exploratory laparotomy for what sounds like it may have been mild endometriosis.     PAST SURGICAL HISTORY: Past Surgical History:  Procedure Laterality Date   BREAST LUMPECTOMY Left 2010   EXPLORATORY LAPAROTOMY  remote   for possible endometriosis   THYROIDECTOMY  January 2011   for papillary thyroid  [place]    FAMILY HISTORY Family History  Problem Relation Age of Onset   Breast cancer Mother 66       survived   Ovarian cancer Sister 19       died  The patients father was murdered in his home at age 75.  Her mother had breast cancer diagnosed in her sixties.  She is 48 years old as of December 2020 the patient's mother's only sister only sister had ovarian cancer diagnosed in her 54s and died from that disease. The patient has one brother and one sister, both in good health. The patient's sister has been tested for the BRCA mutation and was found to be positive. She has undergone bilateral salpingo-oophorectomy and according to the patient is being appropriately screened.   GYNECOLOGIC HISTORY: The patient is GX P0; she never took oral contraceptives; s/p BSO November 2011 with benign pathology   SOCIAL HISTORY: (updated December 2021 She worked part time at [employer] in clinical biochemist.  Her husband, Bernardino, used to work for [employer] in [place], but now they have opened their own business [employer], working primarily on [place]. Remedy also works at the [employer]. They have no children.  The patient is not a church attender.     ADVANCED DIRECTIVES: In the absence of any documents to the contrary the patient's husband is her healthcare power of attorney   HEALTH MAINTENANCE: Social History   Tobacco Use   Smoking status: Never   Smokeless tobacco: Never  Substance Use Topics   Alcohol use: No    Drug use: No     Colonoscopy:  PAP: UTD/Mezer  Bone density:  Lipid panel:  Allergies  Allergen Reactions   Neomycin-Bacitracin Zn-Polymyx Rash    Current Outpatient Medications  Medication Sig Dispense Refill   Calcium Carbonate-Vitamin D  (CALCIUM 600 + D PO) Take 1 tablet by mouth daily.       gabapentin  (NEURONTIN ) 100 MG capsule TAKE 1 CAPSULE(100 MG) BY MOUTH AT BEDTIME 90 capsule 3   metFORMIN (GLUCOPHAGE) 500 MG tablet Take 500 mg by mouth daily.     SYNTHROID 175 MCG tablet TK 1 T PO QAM ON AN EMPTY STOMACH  12   No current facility-administered medications for this visit.    OBJECTIVE: White woman who appears well  Vitals:   01/06/24 0933  BP: 128/65  Pulse: 95  Resp: 16  Temp: 98 F (36.7 C)  SpO2: 100%      Body mass index is 35.23 kg/m.    ECOG FS: 0 Filed Weights   01/06/24 0933  Weight: 218 lb 4.8 oz (99 kg)    Physical Exam Constitutional:      Appearance: Normal appearance.  Cardiovascular:     Rate and Rhythm: Normal rate and regular rhythm.  Chest:     Comments: Bilateral  breast examined. No palpable masses both breasts. No regional adenopathy Musculoskeletal:        General: No swelling. Normal range of motion.     Cervical back: Normal range of motion and neck supple. No rigidity.  Lymphadenopathy:     Cervical: No cervical adenopathy.  Neurological:     Mental Status: She is alert.       LAB RESULTS: Lab Results  Component Value Date   WBC 6.5 06/23/2023   NEUTROABS 3.7 06/23/2023   HGB 13.8 06/23/2023   HCT 40.6 06/23/2023   MCV 85.1 06/23/2023   PLT 345 06/23/2023      Chemistry      Component Value Date/Time   NA 141 06/23/2023 0923   NA 140 12/02/2016 1303   K 3.8 06/23/2023 0923   K 3.6 12/02/2016 1303   CL 104 06/23/2023 0923   CL 103 05/25/2012 1457   CO2 30 06/23/2023 0923   CO2 26 12/02/2016 1303   BUN 16 06/23/2023 0923   BUN 14.8 12/02/2016 1303   CREATININE 0.78 06/23/2023 0923   CREATININE 0.8  12/02/2016 1303      Component Value Date/Time   CALCIUM 9.2 06/23/2023 0923   CALCIUM 8.8 12/02/2016 1303   ALKPHOS 61 06/23/2023 0923   ALKPHOS 60 12/02/2016 1303   AST 15 06/23/2023 0923   AST 13 12/02/2016 1303   ALT 16 06/23/2023 0923   ALT 14 12/02/2016 1303   BILITOT 0.5 06/23/2023 0923   BILITOT 0.37 12/02/2016 1303      STUDIES: MR BREAST BILATERAL W WO CONTRAST INC CAD Result Date: 12/25/2023 CLINICAL DATA:  48 year old female status post left breast lumpectomy in 2010. Here for high-risk screening breast MRI. EXAM: BILATERAL BREAST MRI WITH AND WITHOUT CONTRAST TECHNIQUE: Multiplanar, multisequence MR images of both breasts were obtained prior to and following the intravenous administration of 7.5 ml of Vueway  Three-dimensional MR images were rendered by post-processing of the original MR data on an independent workstation. The three-dimensional MR images were interpreted, and findings are reported in the following complete MRI report for this study. Three dimensional images were evaluated at the independent interpreting workstation using the DynaCAD thin client. COMPARISON:  Prior exams, most recent MRI dated 12/24/2022. FINDINGS: Breast composition: b. Scattered fibroglandular tissue. Background parenchymal enhancement: Minimal Right breast: No mass or abnormal enhancement. Left breast: Post lumpectomy changes. No mass or abnormal enhancement. Lymph nodes: No abnormal appearing lymph nodes. Ancillary findings:  None. IMPRESSION: Sequela of left breast lumpectomy, no MRI evidence of malignancy. RECOMMENDATION: 1. Continued annual screening mammograms. 2. Continued annual high-risk screening breast MRI as clinically indicated. The [employer] of [employer]iology and The Society of Breast Imaging recommends annual breast MRI and mammography in patients with genetics based increased risk, a calculated lifetime risk of 20% for more, a history of radiation therapy at a young age, personal  history of breast cancer with dense breast tissue and those diagnosed with breast cancer before age 40. Others with a personal history of breast cancer should strongly consider supplemental screening with MRI, especially if other risk factors are present. Women with dense breasts who desire supplemental screening should undergo breast MRI. Women with atypia or LCIS should consider supplemental surveillance with MRI, especially if other risk factors are present. BI-RADS CATEGORY  2: Benign. Electronically Signed   By: Curtistine Noble   On: 12/25/2023 12:42     ASSESSMENT: 48 y.o.  BRCA-1 positive [place] woman    (1) status post post left  lumpectomy and sentinel lymph node dissection November of 2010 for a T2 N1(mic), stage IB invasive ductal carcinoma, triple negative, with an MIB-1 of 78%, treated adjuvantly with carboplatin and paclitaxel x3 followed by dose dense doxorubicin and cyclophosphamide x4 followed by radiation completed August of 2011   (2) papillary thyroid  carcinoma post total thyroidectomy in January of 2011 with 5 of 6 lymph nodes involved, status post radioiodine ablation, with post treatment hypothyroidism, followed through doctor Kerr's office   (3) status post removal of a nevoid melanoma, focally superficial level IV, Breslow depth 0.46 mm, from the mid to upper back, with margins initially involved 03/13/1998, status post excision of multiple dysplastic nevi since that time, followed by Dr. Helga  (4) s/p BSO November 2011 with benign pathology  (5) BRCA1 carries a deleterious mutation ([id number]); there was no mutation in the BRCA2 gene (testing 11/04/2008)  (6) intensified screening:  (A) mammography April or May yearly  (B) breast MRI October or November yearly   PLAN:   Assessment and Plan Assessment & Plan  Genetic susceptibility to malignant neoplasm of breast (BRCA1 positive) BRCA1 mutation present, significantly increasing breast cancer risk. Adheres to  surveillance protocol. - Continued annual breast MRI and mammogram. - Scheduled next mammogram for May/June 2026. - Continued monthly breast self-examinations. - Planned follow-up in six months.  History of malignant melanoma of skin Diagnosed in 2000, fair-skinned, no recurrence or new lesions. - Continued annual skin examinations with dermatology.  Colonic polyps, status post recent colonoscopy with polypectomy Recent colonoscopy with polypectomy, pathology pending, asymptomatic post-procedure. - Await pathology results from recent polypectomy. - Continued routine colonoscopic surveillance per gastroenterology recommendations.  *Total Encounter Time as defined by the Centers for Medicare and Medicaid Services includes, in addition to the face-to-face time of a patient visit (documented in the note above) non-face-to-face time: obtaining and reviewing outside history, ordering and reviewing medications, tests or procedures, care coordination (communications with other health care professionals or caregivers) and documentation in the medical record.

## 2024-01-06 NOTE — Progress Notes (Signed)
 ID: Audrey Chambers   DOB: [birth year]  MR#: [id number]  RDW#:[id number]  Patient Care Team: Chrystal Lamarr RAMAN, MD as PCP - General (Family Medicine) Marget Lenis, MD as Consulting Physician (Obstetrics and Gynecology) OTHER MD: Marc Habermann, Chyrl Alexander  CHIEF COMPLAINT: BRCA positive, estrogen receptor negative breast cancer  CURRENT TREATMENT: Observation  INTERVAL HISTORY:  Audrey Chambers returns today for follow-up of her estrogen receptor negative, BRCA1 associated breast cancer.   Discussed the use of AI scribe software for clinical note transcription with the patient, who gave verbal consent to proceed.  History of Present Illness Audrey Chambers is a 48 year old female with a BRCA1 mutation who presents for routine follow-up and intensive screening.  She is undergoing routine follow-up and intensive screening due to her BRCA1 mutation. She alternates between mammograms and MRIs as part of her screening protocol. Her recent mammogram was normal, and she plans to continue with annual MRIs.  She underwent a bilateral salpingo-oophorectomy in 2010 or 2011 and has not had any surgeries since.  She has a history of melanoma diagnosed in 2000 and continues to follow up with dermatology for annual skin checks.     COVID 19 VACCINATION STATUS: Refuses vaccinations for SAR S Covid 2; head COVID August 2022, 2 Paxlobid briefly   HISTORY OF PRESENT ILLNESS: From the original intake note:  In mid October of 2010 [name] palpated a mass in her left breast. She brought this to Dr. Elise attention and he set her up for bilateral mammography October of 2010.  Dr. Leonce was able to palpate a mobile nodule in the left breast at the 12:00 position which by mammography showed up as subtle questionable asymmetry. By ultrasound there was an irregular hypoechoic mass measuring 1.7 cm. Biopsy was performed the same day and showed ([id number]) an invasive mammary carcinoma which was estrogen and  progesterone receptor negative, with an MIB-1 of 78% and no HER-2 amplification, the ratio being 0.91.   With this information the patient was referred to Dr. Gail and bilateral breast MRIs were obtained November 03, 2008. This showed a solitary mass in the left breast measuring 2.2 cm with no evidence of internal mammary or axillary lymph node positivity. At this point the patient was set up for genetic studies. She proved to be BRCA-1 positive. Scans showed no evidence of metastatic disease otherwise a right thyroid  nodule noted at that time.   With this information the patient proceeded to left partial mastectomy with axillary lymph node sampling November 2010. The final pathology (S. [id number]) showed a 2.1 cm invasive ductal carcinoma with negative and ample margins but with evidence of lymphovascular invasion. One out of 3 sentinel lymph nodes was involved by micrometastatic deposit. The tumor was grade 3   Her subsequent history is as detailed below   PAST MEDICAL HISTORY: Past Medical History:  Diagnosis Date   Breast CA (HCC)    breast ca dx 11/10   Melanoma of back (HCC) 2002   resolved   Personal history of chemotherapy    Personal history of radiation therapy    Significant for melanoma removal approximately 10 years ago from the upper back.   Dr. Lorrayne Seashore did the original excision and Dr. Maude Salt did the deeper surgery.  The patient tells me this was superficial and certainly being more than 10 years ago, this is almost certainly cured.  Polycystic ovaries. Status post exploratory laparotomy for what sounds like it may have been mild endometriosis.  PAST SURGICAL HISTORY: Past Surgical History:  Procedure Laterality Date   BREAST LUMPECTOMY Left 2010   EXPLORATORY LAPAROTOMY  remote   for possible endometriosis   THYROIDECTOMY  January 2011   for papillary thyroid  [place]    FAMILY HISTORY Family History  Problem Relation Age of Onset   Breast cancer Mother  59       survived   Ovarian cancer Sister 20       died  The patients father was murdered in his home at age 23.  Her mother had breast cancer diagnosed in her sixties.  She is 49 years old as of December 2020 the patient's mother's only sister only sister had ovarian cancer diagnosed in her 69s and died from that disease. The patient has one brother and one sister, both in good health. The patient's sister has been tested for the BRCA mutation and was found to be positive. She has undergone bilateral salpingo-oophorectomy and according to the patient is being appropriately screened.   GYNECOLOGIC HISTORY: The patient is GX P0; she never took oral contraceptives; s/p BSO November 2011 with benign pathology   SOCIAL HISTORY: (updated December 2021 She worked part time at [employer] in clinical biochemist.  Her husband, Bernardino, used to work for [employer] in [employer], but now they have opened their own business [employer], working primarily on [place]  cars. 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 They have no children.  The patient is not a church attender.     ADVANCED DIRECTIVES: In the absence of any documents to the contrary the patient's husband is her healthcare power of attorney   HEALTH MAINTENANCE: Social History   Tobacco Use   Smoking status: Never   Smokeless tobacco: Never  Substance Use Topics   Alcohol use: No   Drug use: No     Colonoscopy:  PAP: UTD/Mezer  Bone density:  Lipid panel:  Allergies  Allergen Reactions   Neomycin-Bacitracin Zn-Polymyx Rash    Current Outpatient Medications  Medication Sig Dispense Refill   Calcium Carbonate-Vitamin D  (CALCIUM 600 + D PO) Take 1 tablet by mouth daily.       gabapentin  (NEURONTIN ) 100 MG capsule TAKE 1 CAPSULE(100 MG) BY MOUTH AT BEDTIME 90 capsule 3   metFORMIN (GLUCOPHAGE) 500 MG tablet Take 500 mg by mouth daily.     SYNTHROID 175 MCG tablet TK 1 T PO QAM ON AN EMPTY STOMACH  12   No  current facility-administered medications for this visit.    OBJECTIVE: White woman who appears well  Vitals:   01/06/24 0933  BP: 128/65  Pulse: 95  Resp: 16  Temp: 98 F (36.7 C)  SpO2: 100%      Body mass index is 35.23 kg/m.    ECOG FS: 0 Filed Weights   01/06/24 0933  Weight: 218 lb 4.8 oz (99 kg)    Physical Exam Constitutional:      Appearance: Normal appearance.  Cardiovascular:     Rate and Rhythm: Normal rate and regular rhythm.  Chest:     Comments: Bilateral breast examined. No palpable masses both breasts. No regional adenopathy Musculoskeletal:        General: No swelling. Normal range of motion.     Cervical back: Normal range of motion and neck supple. No rigidity.  Lymphadenopathy:     Cervical: No cervical adenopathy.  Neurological:     Mental Status: She is alert.       LAB RESULTS:  Lab Results  Component Value Date   WBC 6.5 06/23/2023   NEUTROABS 3.7 06/23/2023   HGB 13.8 06/23/2023   HCT 40.6 06/23/2023   MCV 85.1 06/23/2023   PLT 345 06/23/2023      Chemistry      Component Value Date/Time   NA 141 06/23/2023 0923   NA 140 12/02/2016 1303   K 3.8 06/23/2023 0923   K 3.6 12/02/2016 1303   CL 104 06/23/2023 0923   CL 103 05/25/2012 1457   CO2 30 06/23/2023 0923   CO2 26 12/02/2016 1303   BUN 16 06/23/2023 0923   BUN 14.8 12/02/2016 1303   CREATININE 0.78 06/23/2023 0923   CREATININE 0.8 12/02/2016 1303      Component Value Date/Time   CALCIUM 9.2 06/23/2023 0923   CALCIUM 8.8 12/02/2016 1303   ALKPHOS 61 06/23/2023 0923   ALKPHOS 60 12/02/2016 1303   AST 15 06/23/2023 0923   AST 13 12/02/2016 1303   ALT 16 06/23/2023 0923   ALT 14 12/02/2016 1303   BILITOT 0.5 06/23/2023 0923   BILITOT 0.37 12/02/2016 1303      STUDIES: MR BREAST BILATERAL W WO CONTRAST INC CAD Result Date: 12/25/2023 CLINICAL DATA:  48 year old female status post left breast lumpectomy in 2010. Here for high-risk screening breast MRI. EXAM:  BILATERAL BREAST MRI WITH AND WITHOUT CONTRAST TECHNIQUE: Multiplanar, multisequence MR images of both breasts were obtained prior to and following the intravenous administration of 7.5 ml of Vueway  Three-dimensional MR images were rendered by post-processing of the original MR data on an independent workstation. The three-dimensional MR images were interpreted, and findings are reported in the following complete MRI report for this study. Three dimensional images were evaluated at the independent interpreting workstation using the DynaCAD thin client. COMPARISON:  Prior exams, most recent MRI dated 12/24/2022. FINDINGS: Breast composition: b. Scattered fibroglandular tissue. Background parenchymal enhancement: Minimal Right breast: No mass or abnormal enhancement. Left breast: Post lumpectomy changes. No mass or abnormal enhancement. Lymph nodes: No abnormal appearing lymph nodes. Ancillary findings:  None. IMPRESSION: Sequela of left breast lumpectomy, no MRI evidence of malignancy. RECOMMENDATION: 1. Continued annual screening mammograms. 2. Continued annual high-risk screening breast MRI as clinically indicated. The [employer] of [employer] and The Society of Breast Imaging recommends annual breast MRI and mammography in patients with genetics based increased risk, a calculated lifetime risk of 20% for more, a history of radiation therapy at a young age, personal history of breast cancer with dense breast tissue and those diagnosed with breast cancer before age 13. Others with a personal history of breast cancer should strongly consider supplemental screening with MRI, especially if other risk factors are present. Women with dense breasts who desire supplemental screening should undergo breast MRI. Women with atypia or LCIS should consider supplemental surveillance with MRI, especially if other risk factors are present. BI-RADS CATEGORY  2: Benign. Electronically Signed   By: Curtistine Noble   On:  12/25/2023 12:42     ASSESSMENT: 48 y.o.  BRCA-1 positive [place] woman    (1) status post post left lumpectomy and sentinel lymph node dissection November of 2010 for a T2 N1(mic), stage IB invasive ductal carcinoma, triple negative, with an MIB-1 of 78%, treated adjuvantly with carboplatin and paclitaxel x3 followed by dose dense doxorubicin and cyclophosphamide x4 followed by radiation completed August of 2011   (2) papillary thyroid  carcinoma post total thyroidectomy in January of 2011 with 5 of 6 lymph nodes involved, status post  radioiodine ablation, with post treatment hypothyroidism, followed through doctor Kerr's office   (3) status post removal of a nevoid melanoma, focally superficial level IV, Breslow depth 0.46 mm, from the mid to upper back, with margins initially involved 03/13/1998, status post excision of multiple dysplastic nevi since that time, followed by Dr. Helga  (4) s/p BSO November 2011 with benign pathology  (5) BRCA1 carries a deleterious mutation ([id number]); there was no mutation in the BRCA2 gene (testing 11/04/2008)  (6) intensified screening:  (A) mammography April or May yearly  (B) breast MRI October or November yearly   PLAN:   Assessment and Plan Assessment & Plan BRCA1 gene mutation BRCA1 mutation increases breast and ovarian cancer risk. Prefers intensive screening over surgery. Acknowledged MRI's sensitivity over mammography but understands screening limitations. - Continue alternating annual mammogram and MRI screenings. - Schedule MRI in mid-December.  Breast cancer, BRCA 1 mutation. Diagnosed in 2010, in remission for 15 years. No new symptoms or changes. - Schedule follow-up physical exam every six months, alternating with gynecologist if possible.  Melanoma No new lesions or changes. - Continue annual dermatological skin checks.  Hypotensive  Asymptomatic, no antihypertensive use. Advised on hydration. - Advise increased water  intake for the next few days.  Total time spent: 30 minutes, patient is a new patient to me transitioning from Dr. Layla upon his retirement.  *Total Encounter Time as defined by the Centers for Medicare and Medicaid Services includes, in addition to the face-to-face time of a patient visit (documented in the note above) non-face-to-face time: obtaining and reviewing outside history, ordering and reviewing medications, tests or procedures, care coordination (communications with other health care professionals or caregivers) and documentation in the medical record.

## 2024-07-06 ENCOUNTER — Inpatient Hospital Stay: Admitting: Hematology and Oncology
